# Patient Record
Sex: Male | Born: 1955 | Race: White | Hispanic: No | Marital: Single
Health system: Southern US, Community
[De-identification: ages and names within clinical notes are randomized; demographics above are authoritative.]

## PROBLEM LIST (undated history)

## (undated) DIAGNOSIS — R569 Unspecified convulsions: Secondary | ICD-10-CM

## (undated) DIAGNOSIS — S32009A Unspecified fracture of unspecified lumbar vertebra, initial encounter for closed fracture: Secondary | ICD-10-CM

---

## 2005-01-23 ENCOUNTER — Emergency Department (HOSPITAL_COMMUNITY): Admission: EM | Admit: 2005-01-23 | Discharge: 2005-01-23 | Payer: Self-pay | Admitting: Emergency Medicine

## 2015-05-09 HISTORY — PX: ORIF ANKLE FRACTURE: SUR919

## 2015-05-09 HISTORY — PX: ORIF WRIST FRACTURE: SHX2133

## 2015-05-27 ENCOUNTER — Inpatient Hospital Stay (HOSPITAL_COMMUNITY)
Admission: EM | Admit: 2015-05-27 | Discharge: 2015-06-04 | DRG: 493 | Disposition: A | Discharge status: Skilled Nursing Facility | Payer: Self-pay | Attending: General Surgery | Admitting: General Surgery

## 2015-05-27 ENCOUNTER — Emergency Department (HOSPITAL_COMMUNITY): Payer: Self-pay

## 2015-05-27 DIAGNOSIS — S52502A Unspecified fracture of the lower end of left radius, initial encounter for closed fracture: Secondary | ICD-10-CM | POA: Diagnosis present

## 2015-05-27 DIAGNOSIS — E871 Hypo-osmolality and hyponatremia: Secondary | ICD-10-CM | POA: Diagnosis present

## 2015-05-27 DIAGNOSIS — Y92410 Unspecified street and highway as the place of occurrence of the external cause: Secondary | ICD-10-CM

## 2015-05-27 DIAGNOSIS — S82892A Other fracture of left lower leg, initial encounter for closed fracture: Secondary | ICD-10-CM | POA: Diagnosis present

## 2015-05-27 DIAGNOSIS — S32028A Other fracture of second lumbar vertebra, initial encounter for closed fracture: Secondary | ICD-10-CM

## 2015-05-27 DIAGNOSIS — S0181XA Laceration without foreign body of other part of head, initial encounter: Secondary | ICD-10-CM | POA: Diagnosis present

## 2015-05-27 DIAGNOSIS — F17200 Nicotine dependence, unspecified, uncomplicated: Secondary | ICD-10-CM | POA: Diagnosis present

## 2015-05-27 DIAGNOSIS — S8262XA Displaced fracture of lateral malleolus of left fibula, initial encounter for closed fracture: Principal | ICD-10-CM | POA: Diagnosis present

## 2015-05-27 DIAGNOSIS — S93432A Sprain of tibiofibular ligament of left ankle, initial encounter: Secondary | ICD-10-CM | POA: Diagnosis present

## 2015-05-27 DIAGNOSIS — D62 Acute posthemorrhagic anemia: Secondary | ICD-10-CM | POA: Diagnosis not present

## 2015-05-27 DIAGNOSIS — S81002A Unspecified open wound, left knee, initial encounter: Secondary | ICD-10-CM | POA: Diagnosis present

## 2015-05-27 DIAGNOSIS — S32029A Unspecified fracture of second lumbar vertebra, initial encounter for closed fracture: Secondary | ICD-10-CM | POA: Diagnosis present

## 2015-05-27 DIAGNOSIS — Z419 Encounter for procedure for purposes other than remedying health state, unspecified: Secondary | ICD-10-CM

## 2015-05-27 DIAGNOSIS — IMO0002 Reserved for concepts with insufficient information to code with codable children: Secondary | ICD-10-CM

## 2015-05-27 DIAGNOSIS — S80212A Abrasion, left knee, initial encounter: Secondary | ICD-10-CM | POA: Diagnosis present

## 2015-05-27 DIAGNOSIS — S62102A Fracture of unspecified carpal bone, left wrist, initial encounter for closed fracture: Secondary | ICD-10-CM | POA: Diagnosis present

## 2015-05-27 DIAGNOSIS — T1490XA Injury, unspecified, initial encounter: Secondary | ICD-10-CM

## 2015-05-27 HISTORY — DX: Unspecified convulsions: R56.9

## 2015-05-27 LAB — CBC
HEMATOCRIT: 41.7 % (ref 39.0–52.0)
HEMOGLOBIN: 14 g/dL (ref 13.0–17.0)
MCH: 29.4 pg (ref 26.0–34.0)
MCHC: 33.6 g/dL (ref 30.0–36.0)
MCV: 87.4 fL (ref 78.0–100.0)
Platelets: 214 10*3/uL (ref 150–400)
RBC: 4.77 MIL/uL (ref 4.22–5.81)
RDW: 13.1 % (ref 11.5–15.5)
WBC: 18.9 10*3/uL — AB (ref 4.0–10.5)

## 2015-05-27 LAB — CDS SEROLOGY

## 2015-05-27 LAB — PROTIME-INR
INR: 1.13 (ref 0.00–1.49)
PROTHROMBIN TIME: 14.7 s (ref 11.6–15.2)

## 2015-05-27 LAB — SAMPLE TO BLOOD BANK

## 2015-05-27 MED ORDER — ONDANSETRON HCL 4 MG/2ML IJ SOLN
INTRAMUSCULAR | Status: AC
  Administered 2015-05-27: 4 mg via INTRAVENOUS
  Filled 2015-05-27: qty 2

## 2015-05-27 MED ORDER — TETANUS-DIPHTH-ACELL PERTUSSIS 5-2.5-18.5 LF-MCG/0.5 IM SUSP
0.5000 mL | Freq: Once | INTRAMUSCULAR | Status: AC
  Administered 2015-05-27: 0.5 mL via INTRAMUSCULAR
  Filled 2015-05-27: qty 0.5

## 2015-05-27 MED ORDER — MORPHINE SULFATE (PF) 4 MG/ML IV SOLN
4.0000 mg | Freq: Once | INTRAVENOUS | Status: AC
  Administered 2015-05-27: 4 mg via INTRAVENOUS
  Filled 2015-05-27: qty 1

## 2015-05-27 NOTE — Progress Notes (Signed)
Orthopedic Tech Progress Note Patient Details:  Stephen Juarez 02/16/1956 [id number] Level 2 trauma ortho visit Patient ID: Stephen Juarez, male   DOB: Aug 16, 1955, 60 y.o.   MRN: [id number]   [name] 05/27/2015, 11:08 PM
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
## 2015-05-28 ENCOUNTER — Emergency Department (HOSPITAL_COMMUNITY): Payer: Self-pay

## 2015-05-28 ENCOUNTER — Emergency Department (HOSPITAL_COMMUNITY): Payer: No Typology Code available for payment source

## 2015-05-28 ENCOUNTER — Encounter (HOSPITAL_COMMUNITY): Payer: Self-pay | Admitting: *Deleted

## 2015-05-28 DIAGNOSIS — S82892A Other fracture of left lower leg, initial encounter for closed fracture: Secondary | ICD-10-CM | POA: Diagnosis present

## 2015-05-28 LAB — COMPREHENSIVE METABOLIC PANEL
ALBUMIN: 3.5 g/dL (ref 3.5–5.0)
ALT: 31 U/L (ref 17–63)
ANION GAP: 14 (ref 5–15)
AST: 44 U/L — AB (ref 15–41)
Alkaline Phosphatase: 77 U/L (ref 38–126)
BILIRUBIN TOTAL: 0.2 mg/dL — AB (ref 0.3–1.2)
BUN: 16 mg/dL (ref 6–20)
CO2: 19 mmol/L — AB (ref 22–32)
Calcium: 8.5 mg/dL — ABNORMAL LOW (ref 8.9–10.3)
Chloride: 98 mmol/L — ABNORMAL LOW (ref 101–111)
Creatinine, Ser: 1.02 mg/dL (ref 0.61–1.24)
GFR calc Af Amer: >60 mL/min (ref >60)
GFR calc non Af Amer: >60 mL/min (ref >60)
GLUCOSE: 101 mg/dL — AB (ref 65–99)
POTASSIUM: 4 mmol/L (ref 3.5–5.1)
SODIUM: 131 mmol/L — AB (ref 135–145)
Total Protein: 6.4 g/dL — ABNORMAL LOW (ref 6.5–8.1)

## 2015-05-28 LAB — ETHANOL: Alcohol, Ethyl (B): 79 mg/dL — ABNORMAL HIGH (ref <5)

## 2015-05-28 MED ORDER — IOHEXOL 300 MG/ML  SOLN
100.0000 mL | Freq: Once | INTRAMUSCULAR | Status: AC | PRN
  Administered 2015-05-28: 100 mL via INTRAVENOUS

## 2015-05-28 MED ORDER — OXYCODONE HCL 5 MG PO TABS
10.0000 mg | ORAL_TABLET | ORAL | Status: DC | PRN
  Administered 2015-05-28: 10 mg via ORAL
  Filled 2015-05-28 (×2): qty 2

## 2015-05-28 MED ORDER — CEFAZOLIN SODIUM 1-5 GM-% IV SOLN
1.0000 g | Freq: Once | INTRAVENOUS | Status: AC
  Administered 2015-05-28: 1 g via INTRAVENOUS
  Filled 2015-05-28: qty 50

## 2015-05-28 MED ORDER — PANTOPRAZOLE SODIUM 40 MG PO TBEC
40.0000 mg | DELAYED_RELEASE_TABLET | Freq: Every day | ORAL | Status: DC
  Filled 2015-05-28: qty 1

## 2015-05-28 MED ORDER — CEFAZOLIN SODIUM 1-5 GM-% IV SOLN
1.0000 g | Freq: Three times a day (TID) | INTRAVENOUS | Status: AC
  Administered 2015-05-28 – 2015-06-01 (×11): 1 g via INTRAVENOUS
  Filled 2015-05-28 (×12): qty 50

## 2015-05-28 MED ORDER — BISACODYL 10 MG RE SUPP: 10.0000 mg | Freq: Every day | RECTAL | Status: DC | PRN

## 2015-05-28 MED ORDER — ONDANSETRON HCL 4 MG/2ML IJ SOLN: 4.0000 mg | Freq: Four times a day (QID) | INTRAMUSCULAR | Status: DC | PRN

## 2015-05-28 MED ORDER — MORPHINE SULFATE (PF) 4 MG/ML IV SOLN
4.0000 mg | Freq: Once | INTRAVENOUS | Status: AC
  Administered 2015-05-28: 4 mg via INTRAVENOUS
  Filled 2015-05-28: qty 1

## 2015-05-28 MED ORDER — HYDROMORPHONE HCL 1 MG/ML IJ SOLN
1.0000 mg | Freq: Once | INTRAMUSCULAR | Status: AC
  Administered 2015-05-28: 1 mg via INTRAVENOUS

## 2015-05-28 MED ORDER — LIDOCAINE-EPINEPHRINE 2 %-1:200000 IJ SOLN
20.0000 mL | Freq: Once | INTRAMUSCULAR | Status: AC
Start: 2015-05-28 — End: 2015-05-28
  Administered 2015-05-28: 20 mL
  Filled 2015-05-28: qty 20

## 2015-05-28 MED ORDER — ONDANSETRON HCL 4 MG PO TABS: 4.0000 mg | ORAL_TABLET | Freq: Four times a day (QID) | ORAL | Status: DC | PRN

## 2015-05-28 MED ORDER — DOCUSATE SODIUM 100 MG PO CAPS
100.0000 mg | ORAL_CAPSULE | Freq: Two times a day (BID) | ORAL | Status: DC
  Administered 2015-05-28 – 2015-05-30 (×5): 100 mg via ORAL
  Filled 2015-05-28 (×5): qty 1

## 2015-05-28 MED ORDER — SODIUM CHLORIDE 0.9 % IV BOLUS (SEPSIS)
1000.0000 mL | Freq: Once | INTRAVENOUS | Status: AC
  Administered 2015-05-28: 1000 mL via INTRAVENOUS

## 2015-05-28 MED ORDER — ENOXAPARIN SODIUM 40 MG/0.4ML ~~LOC~~ SOLN
40.0000 mg | SUBCUTANEOUS | Status: DC
  Administered 2015-05-28: 40 mg via SUBCUTANEOUS
  Filled 2015-05-28: qty 0.4

## 2015-05-28 MED ORDER — ONDANSETRON HCL 4 MG/2ML IJ SOLN
4.0000 mg | Freq: Once | INTRAMUSCULAR | Status: AC
  Administered 2015-05-27: 4 mg via INTRAVENOUS

## 2015-05-28 MED ORDER — CEFAZOLIN SODIUM 1-5 GM-% IV SOLN
1.0000 g | Freq: Three times a day (TID) | INTRAVENOUS | Status: DC
  Filled 2015-05-28 (×2): qty 50

## 2015-05-28 MED ORDER — HYDROMORPHONE HCL 1 MG/ML IJ SOLN
INTRAMUSCULAR | Status: AC
  Administered 2015-05-28: 1 mg via INTRAVENOUS
  Filled 2015-05-28: qty 1

## 2015-05-28 MED ORDER — PANTOPRAZOLE SODIUM 40 MG IV SOLR
40.0000 mg | Freq: Every day | INTRAVENOUS | Status: DC
Start: 2015-05-28 — End: 2015-05-29
  Filled 2015-05-28: qty 40

## 2015-05-28 MED ORDER — HYDROMORPHONE HCL 1 MG/ML IJ SOLN
1.0000 mg | INTRAMUSCULAR | Status: DC | PRN
  Administered 2015-05-28: 1 mg via INTRAVENOUS
  Administered 2015-05-29: 2 mg via INTRAVENOUS
  Administered 2015-05-29: 1 mg via INTRAVENOUS
  Filled 2015-05-28: qty 1
  Filled 2015-05-28: qty 2
  Filled 2015-05-28: qty 1

## 2015-05-28 MED ORDER — SODIUM CHLORIDE 0.9 % IV SOLN: INTRAVENOUS | Status: DC
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
## 2015-05-28 NOTE — Clinical Social Work Note (Signed)
Clinical Social Work Assessment  Patient Details  Name: Stephen Juarez MRN: [id number] Date of Birth: 08-28-1955  Date of referral:  05/28/15               Reason for consult:  Trauma, Housing Concerns/Homelessness                Permission sought to share information with:   (n/a) Permission granted to share information::  No  Name::     n/a  Agency::  n/a  Relationship::  n/a  Contact Information:  n/a  Housing/Transportation Living arrangements for the past 2 months:  [place] of Information:  Patient Patient Interpreter Needed:  None Criminal Activity/Legal Involvement Pertinent to Current Situation/Hospitalization:  No - Comment as needed Significant Relationships:  None, [employer] Lives with:  Self Do you feel safe going back to the place where you live?  Yes Need for family participation in patient care:  No (Coment)  Care giving concerns:  Patient currently stays at a homeless shelter in [place]. Patient has lived there for around four months. Patient was concerned about his lost reading glasses.    Social Worker assessment / plan:  BSW intern met with patient at bedside to complete assessment. Patient confirmed that he stays at a homeless shelter in [place]. Patient stated that he has been staying there for around four months now. Patient stated that he has already contacted the homeless shelter and they stated that the discharge paperwork would suffice to be accepted back into the shelter. Patient stated that he would need transport back to the facility once he is discharged. Patient stated that he does have family in [place], but does not have any contact with them. BSW intern will continue to follow and assist as needed.  Employment status:  Unemployed Forensic scientist:  Other (Comment Required) (Med Pay) PT Recommendations:  No Follow Up Information / Referral to community resources:  Shelter  Patient/Family's Response to care: Patient  stated that his care at the hospital has been, "good."  Patient/Family's Understanding of and Emotional Response to Diagnosis, Current Treatment, and Prognosis:  Patient understands his reason for admission, and patient seems to understand his current treatment and post discharge needs.   Emotional Assessment Appearance:  Appears stated age Attitude/Demeanor/Rapport:   (Appropriate) Affect (typically observed):  Appropriate, Pleasant Orientation:  Oriented to Self, Oriented to Place, Oriented to  Time, Oriented to Situation Alcohol / Substance use:  Alcohol Use Psych involvement (Current and /or in the community):  No (Comment)  Discharge Needs  Concerns to be addressed:  Homelessness Readmission within the last 30 days:  No Current discharge risk:  Homeless Barriers to Discharge:  Continued Medical Work up   [employer], [phone]

## 2015-05-28 NOTE — Consult Note (Signed)
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   Musculoskeletal: Positive for back pain.       Pain gluteus bilaterally  Neurological: Negative.   Psychiatric/Behavioral: Negative.    Blood pressure 113/81, pulse 107, temperature 98.7 F (37.1 C), temperature source Oral, resp. rate 22, height 5' 7"  (1.702 m), weight 79.379 kg (175 lb), SpO2 96 %. Physical Exam  Constitutional: He is oriented to person, place, and time. He appears well-developed and well-nourished.  HENT:  Left periorbital ecchymoses left scalp abrasions  Eyes: Conjunctivae and EOM are normal. Pupils are equal, round, and reactive to light.  Musculoskeletal:  Nontender to palpation and percussion in mid and lower lumbar spine. Straight leg raising negative for pain referable to the back.  Neurological: He is alert and oriented to person, place, and time. He has normal reflexes.  Skin: Skin is warm and dry.  Psychiatric: He has a normal mood and affect. His behavior is normal. Judgment and thought content normal.    Assessment/Plan: L2 vertebral body fracture with minimal displacement. This will heal itself without any significant intervention. Lumbar corset may be used for comfort measures. I can follow the patient up as an outpatient in approximately 3  weeks.  Manmeet Arzola J 05/28/2015, 3:36 PM

## 2015-05-28 NOTE — ED Notes (Signed)
Face cleaned, left knee wound irrigated and dressed with Bacitracin and dressing

## 2015-05-28 NOTE — ED Notes (Signed)
Face cleaned, left knee wound cleaned and irrigated.
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
## 2015-05-29 DIAGNOSIS — S32029A Unspecified fracture of second lumbar vertebra, initial encounter for closed fracture: Secondary | ICD-10-CM | POA: Diagnosis present

## 2015-05-29 DIAGNOSIS — S62102A Fracture of unspecified carpal bone, left wrist, initial encounter for closed fracture: Secondary | ICD-10-CM | POA: Diagnosis present

## 2015-05-29 DIAGNOSIS — D62 Acute posthemorrhagic anemia: Secondary | ICD-10-CM | POA: Diagnosis not present

## 2015-05-29 LAB — CBC
HEMATOCRIT: 31.1 % — AB (ref 39.0–52.0)
HEMOGLOBIN: 10.6 g/dL — AB (ref 13.0–17.0)
MCH: 29.6 pg (ref 26.0–34.0)
MCHC: 34.1 g/dL (ref 30.0–36.0)
MCV: 86.9 fL (ref 78.0–100.0)
Platelets: 158 10*3/uL (ref 150–400)
RBC: 3.58 MIL/uL — ABNORMAL LOW (ref 4.22–5.81)
RDW: 13.1 % (ref 11.5–15.5)
WBC: 8.9 10*3/uL (ref 4.0–10.5)

## 2015-05-29 LAB — BASIC METABOLIC PANEL
ANION GAP: 7 (ref 5–15)
BUN: 14 mg/dL (ref 6–20)
CALCIUM: 8 mg/dL — AB (ref 8.9–10.3)
CO2: 22 mmol/L (ref 22–32)
Chloride: 106 mmol/L (ref 101–111)
Creatinine, Ser: 0.9 mg/dL (ref 0.61–1.24)
GFR calc non Af Amer: >60 mL/min (ref >60)
Glucose, Bld: 122 mg/dL — ABNORMAL HIGH (ref 65–99)
Potassium: 3.8 mmol/L (ref 3.5–5.1)
SODIUM: 135 mmol/L (ref 135–145)

## 2015-05-29 LAB — SURGICAL PCR SCREEN
MRSA, PCR: NEGATIVE
Staphylococcus aureus: POSITIVE — AB

## 2015-05-29 MED ORDER — KETOROLAC TROMETHAMINE 30 MG/ML IJ SOLN
30.0000 mg | Freq: Once | INTRAMUSCULAR | Status: AC
  Administered 2015-05-29: 30 mg via INTRAVENOUS
  Filled 2015-05-29: qty 1

## 2015-05-29 MED ORDER — CHLORHEXIDINE GLUCONATE CLOTH 2 % EX PADS
6.0000 | MEDICATED_PAD | Freq: Every day | CUTANEOUS | Status: AC
  Administered 2015-05-29 – 2015-06-02 (×5): 6 via TOPICAL

## 2015-05-29 MED ORDER — OXYCODONE HCL 5 MG PO TABS: 5.0000 mg | ORAL_TABLET | ORAL | Status: DC | PRN

## 2015-05-29 MED ORDER — POLYETHYLENE GLYCOL 3350 17 G PO PACK
17.0000 g | PACK | Freq: Every day | ORAL | Status: DC
  Administered 2015-05-29 – 2015-06-04 (×4): 17 g via ORAL
  Filled 2015-05-29 (×5): qty 1

## 2015-05-29 MED ORDER — MUPIROCIN 2 % EX OINT
1.0000 "application " | TOPICAL_OINTMENT | Freq: Two times a day (BID) | CUTANEOUS | Status: AC
  Administered 2015-05-29 – 2015-06-03 (×10): 1 via NASAL
  Filled 2015-05-29 (×2): qty 22

## 2015-05-29 MED ORDER — HYDROMORPHONE HCL 1 MG/ML IJ SOLN
0.5000 mg | INTRAMUSCULAR | Status: DC | PRN
  Administered 2015-05-31: 0.5 mg via INTRAVENOUS
  Filled 2015-05-29: qty 1

## 2015-05-29 MED ORDER — CEFAZOLIN SODIUM-DEXTROSE 2-3 GM-% IV SOLR
2.0000 g | INTRAVENOUS | Status: AC
  Administered 2015-05-30: 2 g via INTRAVENOUS
  Filled 2015-05-29 (×2): qty 50

## 2015-05-29 MED ORDER — OXYCODONE HCL 5 MG PO TABS
10.0000 mg | ORAL_TABLET | ORAL | Status: DC | PRN
  Administered 2015-05-29 – 2015-05-30 (×5): 10 mg via ORAL
  Filled 2015-05-29 (×6): qty 2
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
## 2015-05-29 NOTE — H&P (Signed)
H&P update  The surgical history has been reviewed and remains accurate without interval change.  The patient was re-examined and patient's physiologic condition has not changed significantly in the last 30 days. The condition still exists that makes this procedure necessary. The treatment plan remains the same, without new options for care.  No new pharmacological allergies or types of therapy has been initiated that would change the plan or the appropriateness of the plan.  The patient and/or family understand the potential benefits and risks.  Mayra Reel, MD 05/29/2015 9:34 PM

## 2015-05-29 NOTE — Progress Notes (Signed)
Plan for ORIF left wrist and ankle tomorrow and washout of left knee wound NPO after midnight Hold lovenox  N. Glee Arvin, MD [facility] [phone] 7:55 AM

## 2015-05-30 ENCOUNTER — Encounter (HOSPITAL_COMMUNITY): Admission: EM | Disposition: A | Discharge status: Skilled Nursing Facility | Payer: Self-pay | Source: Home / Self Care

## 2015-05-30 ENCOUNTER — Inpatient Hospital Stay (HOSPITAL_COMMUNITY): Payer: Self-pay | Admitting: Anesthesiology

## 2015-05-30 ENCOUNTER — Inpatient Hospital Stay (HOSPITAL_COMMUNITY): Payer: No Typology Code available for payment source | Admitting: Anesthesiology

## 2015-05-30 ENCOUNTER — Inpatient Hospital Stay (HOSPITAL_COMMUNITY): Payer: Self-pay

## 2015-05-30 HISTORY — PX: IRRIGATION AND DEBRIDEMENT KNEE: SHX5185

## 2015-05-30 HISTORY — PX: ORIF ANKLE FRACTURE: SHX5408

## 2015-05-30 HISTORY — PX: OPEN REDUCTION INTERNAL FIXATION (ORIF) DISTAL RADIAL FRACTURE: SHX5989

## 2015-05-30 SURGERY — OPEN REDUCTION INTERNAL FIXATION (ORIF) ANKLE FRACTURE
Anesthesia: General | Site: Knee | Laterality: Left

## 2015-05-30 MED ORDER — ACETAMINOPHEN 650 MG RE SUPP: 650.0000 mg | Freq: Four times a day (QID) | RECTAL | Status: DC | PRN

## 2015-05-30 MED ORDER — STERILE WATER FOR INJECTION IJ SOLN
INTRAMUSCULAR | Status: AC
  Filled 2015-05-30: qty 10

## 2015-05-30 MED ORDER — SUGAMMADEX SODIUM 200 MG/2ML IV SOLN
INTRAVENOUS | Status: AC
  Filled 2015-05-30: qty 2

## 2015-05-30 MED ORDER — KETOROLAC TROMETHAMINE 30 MG/ML IJ SOLN
INTRAMUSCULAR | Status: AC
Start: 2015-05-30 — End: 2015-05-31
  Filled 2015-05-30: qty 1

## 2015-05-30 MED ORDER — FENTANYL CITRATE (PF) 250 MCG/5ML IJ SOLN
INTRAMUSCULAR | Status: DC | PRN
  Administered 2015-05-30: 50 ug via INTRAVENOUS
  Administered 2015-05-30 (×2): 150 ug via INTRAVENOUS
  Administered 2015-05-30: 50 ug via INTRAVENOUS

## 2015-05-30 MED ORDER — CEFAZOLIN SODIUM-DEXTROSE 2-3 GM-% IV SOLR
2.0000 g | Freq: Four times a day (QID) | INTRAVENOUS | Status: DC
  Filled 2015-05-30 (×2): qty 50

## 2015-05-30 MED ORDER — OXYCODONE HCL 5 MG PO TABS
5.0000 mg | ORAL_TABLET | ORAL | Status: DC | PRN
  Administered 2015-05-30 – 2015-05-31 (×3): 15 mg via ORAL
  Filled 2015-05-30 (×3): qty 3

## 2015-05-30 MED ORDER — METOCLOPRAMIDE HCL 5 MG PO TABS: 5.0000 mg | ORAL_TABLET | Freq: Three times a day (TID) | ORAL | Status: DC | PRN

## 2015-05-30 MED ORDER — ONDANSETRON HCL 4 MG PO TABS: 4.0000 mg | ORAL_TABLET | Freq: Four times a day (QID) | ORAL | Status: DC | PRN

## 2015-05-30 MED ORDER — ONDANSETRON HCL 4 MG/2ML IJ SOLN
INTRAMUSCULAR | Status: DC | PRN
  Administered 2015-05-30 (×2): 4 mg via INTRAVENOUS

## 2015-05-30 MED ORDER — SODIUM CHLORIDE 0.9 % IV SOLN
INTRAVENOUS | Status: DC
  Administered 2015-05-30 – 2015-05-31 (×2): via INTRAVENOUS

## 2015-05-30 MED ORDER — LACTATED RINGERS IV SOLN
INTRAVENOUS | Status: DC | PRN
  Administered 2015-05-30 (×2): via INTRAVENOUS

## 2015-05-30 MED ORDER — ARTIFICIAL TEARS OP OINT
TOPICAL_OINTMENT | OPHTHALMIC | Status: DC | PRN
  Administered 2015-05-30: 1 via OPHTHALMIC

## 2015-05-30 MED ORDER — FENTANYL CITRATE (PF) 250 MCG/5ML IJ SOLN
INTRAMUSCULAR | Status: AC
  Filled 2015-05-30: qty 5

## 2015-05-30 MED ORDER — ACETAMINOPHEN 325 MG PO TABS
650.0000 mg | ORAL_TABLET | Freq: Four times a day (QID) | ORAL | Status: DC | PRN
  Administered 2015-06-02: 650 mg via ORAL
  Filled 2015-05-30: qty 2

## 2015-05-30 MED ORDER — HYDROMORPHONE HCL 1 MG/ML IJ SOLN
0.2500 mg | INTRAMUSCULAR | Status: DC | PRN
  Administered 2015-05-30: 0.5 mg via INTRAVENOUS

## 2015-05-30 MED ORDER — OXYCODONE HCL 5 MG/5ML PO SOLN: 5.0000 mg | Freq: Once | ORAL | Status: DC | PRN

## 2015-05-30 MED ORDER — MIDAZOLAM HCL 2 MG/2ML IJ SOLN
INTRAMUSCULAR | Status: AC
  Filled 2015-05-30: qty 2

## 2015-05-30 MED ORDER — ONDANSETRON HCL 4 MG/2ML IJ SOLN
INTRAMUSCULAR | Status: AC
  Filled 2015-05-30: qty 2

## 2015-05-30 MED ORDER — 0.9 % SODIUM CHLORIDE (POUR BTL) OPTIME
TOPICAL | Status: DC | PRN
  Administered 2015-05-30: 3000 mL

## 2015-05-30 MED ORDER — MORPHINE SULFATE (PF) 2 MG/ML IV SOLN: 1.0000 mg | INTRAVENOUS | Status: DC | PRN

## 2015-05-30 MED ORDER — GLYCOPYRROLATE 0.2 MG/ML IJ SOLN
INTRAMUSCULAR | Status: AC
  Filled 2015-05-30: qty 1

## 2015-05-30 MED ORDER — ARTIFICIAL TEARS OP OINT
TOPICAL_OINTMENT | OPHTHALMIC | Status: AC
  Filled 2015-05-30: qty 3.5

## 2015-05-30 MED ORDER — METHOCARBAMOL 500 MG PO TABS
500.0000 mg | ORAL_TABLET | Freq: Four times a day (QID) | ORAL | Status: DC | PRN
  Administered 2015-05-30 – 2015-06-03 (×9): 500 mg via ORAL
  Filled 2015-05-30 (×9): qty 1

## 2015-05-30 MED ORDER — LIDOCAINE HCL (CARDIAC) 20 MG/ML IV SOLN
INTRAVENOUS | Status: DC | PRN
  Administered 2015-05-30: 60 mg via INTRATRACHEAL

## 2015-05-30 MED ORDER — ROCURONIUM BROMIDE 50 MG/5ML IV SOLN
INTRAVENOUS | Status: AC
  Filled 2015-05-30: qty 1

## 2015-05-30 MED ORDER — PROPOFOL 10 MG/ML IV BOLUS
INTRAVENOUS | Status: DC | PRN
  Administered 2015-05-30: 140 mg via INTRAVENOUS

## 2015-05-30 MED ORDER — VANCOMYCIN HCL IN DEXTROSE 1-5 GM/200ML-% IV SOLN: 1000.0000 mg | Freq: Once | INTRAVENOUS | Status: DC

## 2015-05-30 MED ORDER — SUGAMMADEX SODIUM 200 MG/2ML IV SOLN
INTRAVENOUS | Status: DC | PRN
  Administered 2015-05-30: 200 mg via INTRAVENOUS

## 2015-05-30 MED ORDER — BUPIVACAINE HCL (PF) 0.25 % IJ SOLN
INTRAMUSCULAR | Status: AC
  Filled 2015-05-30: qty 30

## 2015-05-30 MED ORDER — METHOCARBAMOL 1000 MG/10ML IJ SOLN
500.0000 mg | Freq: Four times a day (QID) | INTRAVENOUS | Status: DC | PRN
  Filled 2015-05-30: qty 5

## 2015-05-30 MED ORDER — BUPIVACAINE HCL (PF) 0.25 % IJ SOLN
INTRAMUSCULAR | Status: DC | PRN
  Administered 2015-05-30: 30 mL

## 2015-05-30 MED ORDER — METOCLOPRAMIDE HCL 5 MG/ML IJ SOLN: 5.0000 mg | Freq: Three times a day (TID) | INTRAMUSCULAR | Status: DC | PRN

## 2015-05-30 MED ORDER — HYDROMORPHONE HCL 1 MG/ML IJ SOLN
INTRAMUSCULAR | Status: AC
  Filled 2015-05-30: qty 1

## 2015-05-30 MED ORDER — SODIUM CHLORIDE 0.9 % IR SOLN
Status: DC | PRN
  Administered 2015-05-30: 6000 mL

## 2015-05-30 MED ORDER — PROPOFOL 10 MG/ML IV BOLUS
INTRAVENOUS | Status: AC
  Filled 2015-05-30: qty 20

## 2015-05-30 MED ORDER — MIDAZOLAM HCL 2 MG/2ML IJ SOLN
INTRAMUSCULAR | Status: DC | PRN
  Administered 2015-05-30: 2 mg via INTRAVENOUS

## 2015-05-30 MED ORDER — VANCOMYCIN HCL IN DEXTROSE 1-5 GM/200ML-% IV SOLN
INTRAVENOUS | Status: AC
  Filled 2015-05-30: qty 200

## 2015-05-30 MED ORDER — KETOROLAC TROMETHAMINE 30 MG/ML IJ SOLN
30.0000 mg | Freq: Once | INTRAMUSCULAR | Status: AC
  Administered 2015-05-30: 30 mg via INTRAVENOUS

## 2015-05-30 MED ORDER — VECURONIUM BROMIDE 10 MG IV SOLR
INTRAVENOUS | Status: AC
  Filled 2015-05-30: qty 10

## 2015-05-30 MED ORDER — SORBITOL 70 % SOLN: 30.0000 mL | Freq: Every day | Status: DC | PRN

## 2015-05-30 MED ORDER — OXYCODONE HCL ER 10 MG PO T12A
10.0000 mg | EXTENDED_RELEASE_TABLET | Freq: Two times a day (BID) | ORAL | Status: DC
  Administered 2015-05-30 – 2015-06-04 (×9): 10 mg via ORAL
  Filled 2015-05-30 (×10): qty 1

## 2015-05-30 MED ORDER — DIPHENHYDRAMINE HCL 12.5 MG/5ML PO ELIX
25.0000 mg | ORAL_SOLUTION | ORAL | Status: DC | PRN
  Administered 2015-06-01 – 2015-06-02 (×3): 25 mg via ORAL
  Filled 2015-05-30 (×4): qty 10

## 2015-05-30 MED ORDER — GLYCOPYRROLATE 0.2 MG/ML IJ SOLN
INTRAMUSCULAR | Status: DC | PRN
  Administered 2015-05-30: 0.1 mg via INTRAVENOUS

## 2015-05-30 MED ORDER — MAGNESIUM CITRATE PO SOLN: 1.0000 | Freq: Once | ORAL | Status: DC | PRN

## 2015-05-30 MED ORDER — OXYCODONE HCL 5 MG PO TABS: 5.0000 mg | ORAL_TABLET | Freq: Once | ORAL | Status: DC | PRN

## 2015-05-30 MED ORDER — ROCURONIUM BROMIDE 100 MG/10ML IV SOLN
INTRAVENOUS | Status: DC | PRN
  Administered 2015-05-30: 40 mg via INTRAVENOUS
  Administered 2015-05-30: 10 mg via INTRAVENOUS

## 2015-05-30 MED ORDER — LACTATED RINGERS IV SOLN
INTRAVENOUS | Status: DC
  Administered 2015-05-30: 15:00:00 via INTRAVENOUS

## 2015-05-30 MED ORDER — VECURONIUM BROMIDE 10 MG IV SOLR
INTRAVENOUS | Status: DC | PRN
  Administered 2015-05-30: 2 mg via INTRAVENOUS
  Administered 2015-05-30: 1 mg via INTRAVENOUS
  Administered 2015-05-30: 2 mg via INTRAVENOUS

## 2015-05-30 MED ORDER — POLYETHYLENE GLYCOL 3350 17 G PO PACK
17.0000 g | PACK | Freq: Every day | ORAL | Status: DC | PRN
  Filled 2015-05-30: qty 1

## 2015-05-30 MED ORDER — ENOXAPARIN SODIUM 40 MG/0.4ML ~~LOC~~ SOLN
40.0000 mg | SUBCUTANEOUS | Status: AC
  Administered 2015-05-31: 40 mg via SUBCUTANEOUS
  Filled 2015-05-30: qty 0.4

## 2015-05-30 MED ORDER — PROMETHAZINE HCL 25 MG/ML IJ SOLN: 6.2500 mg | INTRAMUSCULAR | Status: DC | PRN

## 2015-05-30 MED ORDER — LIDOCAINE HCL (CARDIAC) 20 MG/ML IV SOLN
INTRAVENOUS | Status: AC
  Filled 2015-05-30: qty 5

## 2015-05-30 MED ORDER — ONDANSETRON HCL 4 MG/2ML IJ SOLN
4.0000 mg | Freq: Four times a day (QID) | INTRAMUSCULAR | Status: DC | PRN
  Administered 2015-06-01: 4 mg via INTRAVENOUS
  Filled 2015-05-30: qty 2

## 2015-05-30 SURGICAL SUPPLY — 124 items
BANDAGE ACE 4X5 VEL STRL LF (GAUZE/BANDAGES/DRESSINGS) ×4 IMPLANT
BANDAGE ACE 6X5 VEL STRL LF (GAUZE/BANDAGES/DRESSINGS) ×2 IMPLANT
BANDAGE ELASTIC 3 VELCRO ST LF (GAUZE/BANDAGES/DRESSINGS) ×5 IMPLANT
BANDAGE ELASTIC 4 VELCRO ST LF (GAUZE/BANDAGES/DRESSINGS) ×5 IMPLANT
BANDAGE ELASTIC 6 VELCRO ST LF (GAUZE/BANDAGES/DRESSINGS) IMPLANT
BANDAGE ESMARK 6X9 LF (GAUZE/BANDAGES/DRESSINGS) IMPLANT
BIT DRILL 2.2 SS TIBIAL (BIT) ×2 IMPLANT
BLADE SURG 10 STRL SS (BLADE) ×5 IMPLANT
BLADE SURG 15 STRL LF DISP TIS (BLADE) ×3 IMPLANT
BLADE SURG 15 STRL SS (BLADE) ×20
BLADE SURG ROTATE 9660 (MISCELLANEOUS) IMPLANT
BNDG CMPR 9X4 STRL LF SNTH (GAUZE/BANDAGES/DRESSINGS) ×3
BNDG CMPR 9X6 STRL LF SNTH (GAUZE/BANDAGES/DRESSINGS)
BNDG CMPR MED 15X6 ELC VLCR LF (GAUZE/BANDAGES/DRESSINGS) ×3
BNDG COHESIVE 4X5 TAN STRL (GAUZE/BANDAGES/DRESSINGS) ×5 IMPLANT
BNDG COHESIVE 6X5 TAN STRL LF (GAUZE/BANDAGES/DRESSINGS) ×5 IMPLANT
BNDG ELASTIC 6X15 VLCR STRL LF (GAUZE/BANDAGES/DRESSINGS) ×2 IMPLANT
BNDG ESMARK 4X9 LF (GAUZE/BANDAGES/DRESSINGS) ×5 IMPLANT
BNDG ESMARK 6X9 LF (GAUZE/BANDAGES/DRESSINGS)
BNDG GAUZE ELAST 4 BULKY (GAUZE/BANDAGES/DRESSINGS) ×5 IMPLANT
CANISTER SUCT 3000ML PPV (MISCELLANEOUS) ×5 IMPLANT
CANISTER WOUND CARE 500ML ATS (WOUND CARE) ×2 IMPLANT
CORDS BIPOLAR (ELECTRODE) ×5 IMPLANT
COVER SURGICAL LIGHT HANDLE (MISCELLANEOUS) ×5 IMPLANT
CUFF TOURNIQUET SINGLE 18IN (TOURNIQUET CUFF) ×5 IMPLANT
CUFF TOURNIQUET SINGLE 24IN (TOURNIQUET CUFF) IMPLANT
CUFF TOURNIQUET SINGLE 34IN LL (TOURNIQUET CUFF) IMPLANT
CUFF TOURNIQUET SINGLE 44IN (TOURNIQUET CUFF) ×5 IMPLANT
DRAPE C-ARM 42X72 X-RAY (DRAPES) ×5 IMPLANT
DRAPE C-ARMOR (DRAPES) ×5 IMPLANT
DRAPE IMP U-DRAPE 54X76 (DRAPES) ×5 IMPLANT
DRAPE INCISE IOBAN 66X45 STRL (DRAPES) ×2 IMPLANT
DRAPE ORTHO SPLIT 77X108 STRL (DRAPES) ×5
DRAPE SURG ORHT 6 SPLT 77X108 (DRAPES) ×3 IMPLANT
DRAPE U-SHAPE 47X51 STRL (DRAPES) ×5 IMPLANT
DRILL 2.6X122MM WL AO SHAFT (BIT) ×2 IMPLANT
DRSG MEPITEL 4X7.2 (GAUZE/BANDAGES/DRESSINGS) ×2 IMPLANT
DRSG PAD ABDOMINAL 8X10 ST (GAUZE/BANDAGES/DRESSINGS) ×6 IMPLANT
DRSG VAC ATS SM SENSATRAC (GAUZE/BANDAGES/DRESSINGS) ×2 IMPLANT
DURAPREP 26ML APPLICATOR (WOUND CARE) ×5 IMPLANT
ELECT CAUTERY BLADE 6.4 (BLADE) ×10 IMPLANT
ELECT REM PT RETURN 9FT ADLT (ELECTROSURGICAL) ×5
ELECTRODE REM PT RTRN 9FT ADLT (ELECTROSURGICAL) ×3 IMPLANT
FACESHIELD WRAPAROUND (MASK) ×5 IMPLANT
FACESHIELD WRAPAROUND OR TEAM (MASK) ×3 IMPLANT
GAUZE SPONGE 4X4 12PLY STRL (GAUZE/BANDAGES/DRESSINGS) ×5 IMPLANT
GAUZE XEROFORM 1X8 LF (GAUZE/BANDAGES/DRESSINGS) ×5 IMPLANT
GAUZE XEROFORM 5X9 LF (GAUZE/BANDAGES/DRESSINGS) ×5 IMPLANT
GLOVE SKINSENSE NS SZ7.5 (GLOVE) ×8
GLOVE SKINSENSE STRL SZ7.5 (GLOVE) ×12 IMPLANT
GLOVE SURG SYN 7.5  E (GLOVE) ×4
GLOVE SURG SYN 7.5 E (GLOVE) ×6 IMPLANT
GLOVE SURG SYN 7.5 PF PI (GLOVE) ×6 IMPLANT
GOWN STRL REIN XL XLG (GOWN DISPOSABLE) ×10 IMPLANT
HANDPIECE INTERPULSE COAX TIP (DISPOSABLE) ×5
K-WIRE 1.6 (WIRE) ×5
K-WIRE FX5X1.6XNS BN SS (WIRE) ×3
K-WIRE ORTHOPEDIC 1.4X150L (WIRE) ×10
KIT BASIN OR (CUSTOM PROCEDURE TRAY) ×5 IMPLANT
KIT ROOM TURNOVER OR (KITS) ×5 IMPLANT
KWIRE FX5X1.6XNS BN SS (WIRE) IMPLANT
KWIRE ORTHOPEDIC 1.4X150L (WIRE) IMPLANT
MANIFOLD NEPTUNE II (INSTRUMENTS) ×5 IMPLANT
NDL HYPO 25GX1X1/2 BEV (NEEDLE) IMPLANT
NEEDLE 22X1 1/2 (OR ONLY) (NEEDLE) IMPLANT
NEEDLE HYPO 25GX1X1/2 BEV (NEEDLE) IMPLANT
NS IRRIG 1000ML POUR BTL (IV SOLUTION) ×5 IMPLANT
PACK ORTHO EXTREMITY (CUSTOM PROCEDURE TRAY) ×5 IMPLANT
PACK UNIVERSAL I (CUSTOM PROCEDURE TRAY) ×5 IMPLANT
PAD ABD 8X10 STRL (GAUZE/BANDAGES/DRESSINGS) ×2 IMPLANT
PAD ARMBOARD 7.5X6 YLW CONV (MISCELLANEOUS) ×10 IMPLANT
PAD CAST 3X4 CTTN HI CHSV (CAST SUPPLIES) ×6 IMPLANT
PAD CAST 4YDX4 CTTN HI CHSV (CAST SUPPLIES) ×3 IMPLANT
PADDING CAST COTTON 3X4 STRL (CAST SUPPLIES) ×10
PADDING CAST COTTON 4X4 STRL (CAST SUPPLIES) ×10
PADDING CAST COTTON 6X4 STRL (CAST SUPPLIES) ×5 IMPLANT
PLATE 6H 113MM (Plate) ×2 IMPLANT
PLATE MEDIUM DVR LEFT (Plate) ×2 IMPLANT
SCREW BONE 3.5X16MM (Screw) ×2 IMPLANT
SCREW BONE 3.5X20MM (Screw) ×2 IMPLANT
SCREW BONE NON-LCKING 3.5X12MM (Screw) ×6 IMPLANT
SCREW LOCK 14X2.7X 3 LD TPR (Screw) IMPLANT
SCREW LOCK 16X2.7X 3 LD TPR (Screw) IMPLANT
SCREW LOCK 18X2.7X 3 LD TPR (Screw) IMPLANT
SCREW LOCK 20X2.7X 3 LD TPR (Screw) IMPLANT
SCREW LOCKING 2.7X14 (Screw) ×15 IMPLANT
SCREW LOCKING 2.7X15MM (Screw) ×4 IMPLANT
SCREW LOCKING 2.7X16 (Screw) ×15 IMPLANT
SCREW LOCKING 2.7X18 (Screw) ×5 IMPLANT
SCREW LOCKING 2.7X20MM (Screw) ×10 IMPLANT
SCREW LOCKING 3.5X16MM (Screw) ×2 IMPLANT
SCREW NLOCK 3.5X55 (Screw) ×2 IMPLANT
SCREW NONLOCK 2.7X18MM (Screw) ×2 IMPLANT
SET CYSTO W/LG BORE CLAMP LF (SET/KITS/TRAYS/PACK) ×2 IMPLANT
SET HNDPC FAN SPRY TIP SCT (DISPOSABLE) ×3 IMPLANT
SPLINT FIBERGLASS 3X12 (CAST SUPPLIES) ×4 IMPLANT
SPLINT FIBERGLASS 3X35 (CAST SUPPLIES) ×4 IMPLANT
SPLINT FIBERGLASS 4X30 (CAST SUPPLIES) ×4 IMPLANT
SPONGE GAUZE 4X4 12PLY STER LF (GAUZE/BANDAGES/DRESSINGS) ×4 IMPLANT
SPONGE LAP 18X18 X RAY DECT (DISPOSABLE) ×5 IMPLANT
SPONGE LAP 4X18 X RAY DECT (DISPOSABLE) IMPLANT
STOCKINETTE IMPERVIOUS 9X36 MD (GAUZE/BANDAGES/DRESSINGS) ×5 IMPLANT
SUCTION FRAZIER HANDLE 10FR (MISCELLANEOUS) ×2
SUCTION TUBE FRAZIER 10FR DISP (MISCELLANEOUS) ×3 IMPLANT
SUT ETHILON 2 0 FS 18 (SUTURE) ×5 IMPLANT
SUT ETHILON 3 0 PS 1 (SUTURE) ×4 IMPLANT
SUT ETHILON 4 0 PS 2 18 (SUTURE) ×5 IMPLANT
SUT MNCRL AB 4-0 PS2 18 (SUTURE) IMPLANT
SUT MON AB 2-0 CT1 36 (SUTURE) ×5 IMPLANT
SUT PROLENE 3 0 PS 2 (SUTURE) IMPLANT
SUT VIC AB 0 CT1 27 (SUTURE) ×10
SUT VIC AB 0 CT1 27XBRD ANBCTR (SUTURE) IMPLANT
SUT VIC AB 2-0 CT1 27 (SUTURE) ×10
SUT VIC AB 2-0 CT1 TAPERPNT 27 (SUTURE) IMPLANT
SUT VIC AB 3-0 FS2 27 (SUTURE) IMPLANT
SYR CONTROL 10ML LL (SYRINGE) ×2 IMPLANT
TOWEL OR 17X24 6PK STRL BLUE (TOWEL DISPOSABLE) ×5 IMPLANT
TOWEL OR 17X26 10 PK STRL BLUE (TOWEL DISPOSABLE) ×10 IMPLANT
TUBE ANAEROBIC SPECIMEN COL (MISCELLANEOUS) ×5 IMPLANT
TUBE CONNECTING 12'X1/4 (SUCTIONS) ×1
TUBE CONNECTING 12X1/4 (SUCTIONS) ×4 IMPLANT
UNDERPAD 30X30 INCONTINENT (UNDERPADS AND DIAPERS) ×5 IMPLANT
WATER STERILE IRR 1000ML POUR (IV SOLUTION) ×5 IMPLANT
YANKAUER SUCT BULB TIP NO VENT (SUCTIONS) ×5 IMPLANT

## 2015-05-30 NOTE — Progress Notes (Signed)
PT Cancellation Note  Patient Details Name: Stephen Juarez MRN: [id number] DOB: [birth year]   Cancelled Treatment:    Reason Eval/Treat Not Completed: Other (comment)   Plan for surgeries today;   Will follow-up for PT eval postop;   Thanks,  Van Clines, PT  Acute Rehabilitation Services Pager [phone] Office [phone]    Van Clines [facility] 05/30/2015, 1:53 PM

## 2015-05-30 NOTE — Clinical Documentation Improvement (Signed)
Trauma  Abnormal Lab/Test Results:  Na+ 131 on admit  Please document response in next progress note NOT in BPA drop down box if applicable. Thanks!  Possible Clinical Conditions associated with below indicators  Hyponatremia  Hypernatremia  Other Condition  Cannot Clinically Determine  Supporting Information:  Treatment Provided:  0.9% NaCl IV infusion  Repeat draw was 135  Please exercise your independent, professional judgment when responding. A specific answer is not anticipated or expected.  Thank You,  Shellee Milo RN, BSN, CCDS Health Information Management [place] [phone]; Cell: [phone]

## 2015-05-30 NOTE — Progress Notes (Signed)
Rept called to [facility] RN in preop holding area. Pt transferred to OR via hospital bed without incident. No change from AM assessment. Family aware.

## 2015-05-30 NOTE — Transfer of Care (Signed)
Immediate Anesthesia Transfer of Care Note  Patient: Stephen Juarez  Procedure(s) Performed: Procedure(s): OPEN REDUCTION INTERNAL FIXATION (ORIF) LEFT ANKLE FRACTURE (Left) OPEN REDUCTION INTERNAL FIXATION (ORIF) LEFT DISTAL RADIUS FRACTURE (Left) IRRIGATION AND DEBRIDEMENT LEFT KNEE (Left)  Patient Location: PACU  Anesthesia Type:General  Level of Consciousness: awake, alert , oriented and patient cooperative  Airway & Oxygen Therapy: Patient Spontanous Breathing and Patient connected to nasal cannula oxygen  Post-op Assessment: Report given to RN, Post -op Vital signs reviewed and stable and Patient moving all extremities X 4  Post vital signs: Reviewed and stable  Last Vitals:  Filed Vitals:   05/30/15 1337 05/30/15 1748  BP: 105/75   Pulse: 87   Temp: 36.8 C 36.4 C  Resp: 20     Complications: No apparent anesthesia complications

## 2015-05-30 NOTE — Progress Notes (Signed)
OT Cancellation Note  Patient Details Name: Stephen Juarez MRN: [id number] DOB: 1955-12-24   Cancelled Treatment:    Reason Eval/Treat Not Completed: Patient at procedure or test/ unavailable. Pt taken down for surgeries today. Will follow-up for OT eval postop.  Nils Pyle, OTR/L Pager: [phone] 05/30/2015, 2:16 PM

## 2015-05-30 NOTE — Anesthesia Preprocedure Evaluation (Addendum)
Anesthesia Evaluation  Patient identified by MRN, date of birth, ID band Patient awake    Reviewed: Allergy & Precautions, NPO status , Patient's Chart, lab work & pertinent test results  Airway Mallampati: II  TM Distance: >3 FB Neck ROM: Full    Dental  (+) Dental Advisory Given   Pulmonary Current Smoker,    breath sounds clear to auscultation       Cardiovascular negative cardio ROS   Rhythm:Regular Rate:Normal     Neuro/Psych negative neurological ROS     GI/Hepatic negative GI ROS, Neg liver ROS,   Endo/Other  negative endocrine ROS  Renal/GU negative Renal ROS     Musculoskeletal negative musculoskeletal ROS (+)   Abdominal   Peds  Hematology  (+) anemia ,   Anesthesia Other Findings   Reproductive/Obstetrics                            Lab Results  Component Value Date   WBC 8.9 05/29/2015   HGB 10.6* 05/29/2015   HCT 31.1* 05/29/2015   MCV 86.9 05/29/2015   PLT 158 05/29/2015   Lab Results  Component Value Date   CREATININE 0.90 05/29/2015   BUN 14 05/29/2015   NA 135 05/29/2015   K 3.8 05/29/2015   CL 106 05/29/2015   CO2 22 05/29/2015    Anesthesia Physical Anesthesia Plan  ASA: II  Anesthesia Plan: General   Post-op Pain Management:    Induction: Intravenous  Airway Management Planned: Oral ETT  Additional Equipment:   Intra-op Plan:   Post-operative Plan: Extubation in OR  Informed Consent: I have reviewed the patients History and Physical, chart, labs and discussed the procedure including the risks, benefits and alternatives for the proposed anesthesia with the patient or authorized representative who has indicated his/her understanding and acceptance.   Dental advisory given  Plan Discussed with: CRNA  Anesthesia Plan Comments:         Anesthesia Quick Evaluation
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
## 2015-05-30 NOTE — Anesthesia Procedure Notes (Signed)
Procedure Name: Intubation Date/Time: 05/30/2015 2:51 PM Performed by: Jerilee Hoh Pre-anesthesia Checklist: Patient identified, Emergency Drugs available, Suction available and Patient being monitored Patient Re-evaluated:Patient Re-evaluated prior to inductionOxygen Delivery Method: Circle system utilized Preoxygenation: Pre-oxygenation with 100% oxygen Intubation Type: IV induction Ventilation: Mask ventilation without difficulty Laryngoscope Size: Mac and 3 Grade View: Grade I Tube type: Oral Tube size: 7.5 mm Number of attempts: 1 Airway Equipment and Method: Stylet Placement Confirmation: ETT inserted through vocal cords under direct vision,  positive ETCO2 and breath sounds checked- equal and bilateral Secured at: 23 cm Tube secured with: Tape Dental Injury: Teeth and Oropharynx as per pre-operative assessment

## 2015-05-31 ENCOUNTER — Encounter (HOSPITAL_COMMUNITY): Payer: Self-pay | Admitting: General Practice

## 2015-05-31 DIAGNOSIS — S81002A Unspecified open wound, left knee, initial encounter: Secondary | ICD-10-CM | POA: Diagnosis present

## 2015-05-31 MED ORDER — OXYCODONE HCL 5 MG PO TABS
10.0000 mg | ORAL_TABLET | ORAL | Status: DC | PRN
  Administered 2015-05-31 (×2): 15 mg via ORAL
  Administered 2015-06-01: 10 mg via ORAL
  Administered 2015-06-01: 15 mg via ORAL
  Administered 2015-06-01: 10 mg via ORAL
  Administered 2015-06-02: 15 mg via ORAL
  Administered 2015-06-03 – 2015-06-04 (×3): 20 mg via ORAL
  Filled 2015-05-31: qty 3
  Filled 2015-05-31: qty 2
  Filled 2015-05-31 (×2): qty 4
  Filled 2015-05-31: qty 2
  Filled 2015-05-31 (×2): qty 3
  Filled 2015-05-31: qty 4
  Filled 2015-05-31: qty 3

## 2015-05-31 MED ORDER — DOCUSATE SODIUM 100 MG PO CAPS
200.0000 mg | ORAL_CAPSULE | Freq: Two times a day (BID) | ORAL | Status: DC
  Administered 2015-05-31 – 2015-06-04 (×8): 200 mg via ORAL
  Filled 2015-05-31 (×8): qty 2

## 2015-05-31 MED ORDER — CEFAZOLIN SODIUM-DEXTROSE 2-3 GM-% IV SOLR
2.0000 g | INTRAVENOUS | Status: DC
  Filled 2015-05-31: qty 50

## 2015-05-31 MED ORDER — CEFAZOLIN SODIUM 1-5 GM-% IV SOLN: 1.0000 g | Freq: Three times a day (TID) | INTRAVENOUS | Status: DC
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
## 2015-05-31 NOTE — Progress Notes (Signed)
Met with pt to discuss dc plans; was alerted this morning that pt likely will need home VAC upon dc.  Pt states he planned to dc to [facility], and has concerns that he will be able to manage there.  He states that there will be no one to assist him at the shelter, and that he has a small cubicle he stays in.  If dc'd with wound vac, pt will need an area to store wound VAC supplies and to plug VAC in to charge.  Confirmed with [facility] that they would likely be unable to manage a wound VAC in a shelter.   Pt has no other options for discharge at this time.   CSW consulted to facilitate possible dc to SNF at discharge.   Will follow progress.  Reinaldo Raddle, RN, BSN  Trauma/Neuro ICU Case Manager [phone]

## 2015-05-31 NOTE — Progress Notes (Signed)
   Subjective:  Patient reports pain as mild.  No events.  Objective:   VITALS:   Filed Vitals:   05/30/15 1904 05/30/15 2019 05/30/15 2300 05/31/15 0430  BP: 128/76 115/76 108/71 128/82  Pulse: 93 99 89 99  Temp: 97.8 F (36.6 C) 97.3 F (36.3 C) 98.4 F (36.9 C) 98.5 F (36.9 C)  TempSrc: Oral Oral Oral Oral  Resp: Height:      Weight:      SpO2: 94% 99% 100% 99%    Fingers, toes wwp Splints intact NVI Wound VAC in place   Lab Results  Component Value Date   WBC 8.9 05/29/2015   HGB 10.6* 05/29/2015   HCT 31.1* 05/29/2015   MCV 86.9 05/29/2015   PLT 158 05/29/2015     Assessment/Plan:  1 Day Post-Op   - NWB LLE - PFWB LUE - up with PT/OT - lovenox for DVT ppx - will return to OR for repeat I&D left knee wound tomorrow - recommend beginning application for home wound vac as patient will likely need this to allow wound to heal by secondary intention - NPO after midnight  Cheral Almas 05/31/2015, 7:43 AM [phone]

## 2015-05-31 NOTE — Anesthesia Postprocedure Evaluation (Signed)
Anesthesia Post Note  Patient: Stephen Juarez  Procedure(s) Performed: Procedure(s) (LRB): OPEN REDUCTION INTERNAL FIXATION (ORIF) LEFT ANKLE FRACTURE (Left) OPEN REDUCTION INTERNAL FIXATION (ORIF) LEFT DISTAL RADIUS FRACTURE (Left) IRRIGATION AND DEBRIDEMENT LEFT KNEE (Left)  Patient location during evaluation: PACU Anesthesia Type: General Level of consciousness: awake and alert and patient cooperative Pain management: pain level controlled Vital Signs Assessment: post-procedure vital signs reviewed and stable Respiratory status: spontaneous breathing and respiratory function stable Cardiovascular status: stable Anesthetic complications: no    Last Vitals:  Filed Vitals:   05/30/15 2300 05/31/15 0430  BP: 108/71 128/82  Pulse: 89 99  Temp: 36.9 C 36.9 C  Resp: 18 18    Last Pain:  Filed Vitals:   05/31/15 0612  PainSc: Asleep                 Aimie Wagman S

## 2015-06-01 ENCOUNTER — Inpatient Hospital Stay (HOSPITAL_COMMUNITY): Payer: No Typology Code available for payment source | Admitting: Anesthesiology

## 2015-06-01 ENCOUNTER — Encounter (HOSPITAL_COMMUNITY): Admission: EM | Disposition: A | Discharge status: Skilled Nursing Facility | Payer: Self-pay | Source: Home / Self Care

## 2015-06-01 HISTORY — PX: I & D EXTREMITY: SHX5045

## 2015-06-01 SURGERY — IRRIGATION AND DEBRIDEMENT EXTREMITY
Anesthesia: General | Laterality: Left

## 2015-06-01 MED ORDER — HYDROMORPHONE HCL 1 MG/ML IJ SOLN: 0.2500 mg | INTRAMUSCULAR | Status: DC | PRN

## 2015-06-01 MED ORDER — MIDAZOLAM HCL 2 MG/2ML IJ SOLN
INTRAMUSCULAR | Status: AC
  Filled 2015-06-01: qty 2

## 2015-06-01 MED ORDER — LIDOCAINE HCL (CARDIAC) 20 MG/ML IV SOLN
INTRAVENOUS | Status: DC | PRN
  Administered 2015-06-01: 80 mg via INTRAVENOUS

## 2015-06-01 MED ORDER — SODIUM CHLORIDE 0.9 % IR SOLN
Status: DC | PRN
  Administered 2015-06-01: 1000 mL
  Administered 2015-06-01: 3000 mL

## 2015-06-01 MED ORDER — ONDANSETRON HCL 4 MG/2ML IJ SOLN
INTRAMUSCULAR | Status: AC
  Filled 2015-06-01: qty 2

## 2015-06-01 MED ORDER — PROPOFOL 10 MG/ML IV BOLUS
INTRAVENOUS | Status: DC | PRN
  Administered 2015-06-01: 200 mg via INTRAVENOUS

## 2015-06-01 MED ORDER — LACTATED RINGERS IV SOLN: INTRAVENOUS | Status: DC

## 2015-06-01 MED ORDER — FENTANYL CITRATE (PF) 250 MCG/5ML IJ SOLN
INTRAMUSCULAR | Status: AC
  Filled 2015-06-01: qty 5

## 2015-06-01 MED ORDER — PROMETHAZINE HCL 25 MG/ML IJ SOLN: 6.2500 mg | INTRAMUSCULAR | Status: DC | PRN

## 2015-06-01 MED ORDER — LIDOCAINE HCL (CARDIAC) 20 MG/ML IV SOLN
INTRAVENOUS | Status: AC
  Filled 2015-06-01: qty 5

## 2015-06-01 MED ORDER — FENTANYL CITRATE (PF) 100 MCG/2ML IJ SOLN
INTRAMUSCULAR | Status: DC | PRN
  Administered 2015-06-01 (×2): 50 ug via INTRAVENOUS

## 2015-06-01 MED ORDER — ROCURONIUM BROMIDE 50 MG/5ML IV SOLN
INTRAVENOUS | Status: AC
  Filled 2015-06-01: qty 1

## 2015-06-01 MED ORDER — MEPERIDINE HCL 25 MG/ML IJ SOLN: 6.2500 mg | INTRAMUSCULAR | Status: DC | PRN

## 2015-06-01 MED ORDER — LACTATED RINGERS IV SOLN
INTRAVENOUS | Status: DC | PRN
  Administered 2015-06-01: 13:00:00 via INTRAVENOUS

## 2015-06-01 MED ORDER — PHENYLEPHRINE 40 MCG/ML (10ML) SYRINGE FOR IV PUSH (FOR BLOOD PRESSURE SUPPORT)
PREFILLED_SYRINGE | INTRAVENOUS | Status: AC
  Filled 2015-06-01: qty 10

## 2015-06-01 MED ORDER — PROPOFOL 10 MG/ML IV BOLUS
INTRAVENOUS | Status: AC
  Filled 2015-06-01: qty 20

## 2015-06-01 MED ORDER — PHENYLEPHRINE HCL 10 MG/ML IJ SOLN
INTRAMUSCULAR | Status: DC | PRN
  Administered 2015-06-01 (×4): 80 ug via INTRAVENOUS

## 2015-06-01 MED ORDER — DEXAMETHASONE SODIUM PHOSPHATE 4 MG/ML IJ SOLN
INTRAMUSCULAR | Status: AC
  Filled 2015-06-01: qty 2

## 2015-06-01 MED ORDER — CEFAZOLIN SODIUM-DEXTROSE 2-3 GM-% IV SOLR
INTRAVENOUS | Status: DC | PRN
  Administered 2015-06-01: 2 g via INTRAVENOUS

## 2015-06-01 SURGICAL SUPPLY — 66 items
BANDAGE ELASTIC 3 VELCRO ST LF (GAUZE/BANDAGES/DRESSINGS) IMPLANT
BLADE SURG 10 STRL SS (BLADE) ×3 IMPLANT
BNDG COHESIVE 1X5 TAN STRL LF (GAUZE/BANDAGES/DRESSINGS) IMPLANT
BNDG COHESIVE 4X5 TAN STRL (GAUZE/BANDAGES/DRESSINGS) ×3 IMPLANT
BNDG COHESIVE 6X5 TAN STRL LF (GAUZE/BANDAGES/DRESSINGS) ×6 IMPLANT
BNDG CONFORM 3 STRL LF (GAUZE/BANDAGES/DRESSINGS) IMPLANT
BNDG GAUZE STRTCH 6 (GAUZE/BANDAGES/DRESSINGS) ×9 IMPLANT
CANISTER WOUND CARE 500ML ATS (WOUND CARE) ×2 IMPLANT
CORDS BIPOLAR (ELECTRODE) IMPLANT
COVER SURGICAL LIGHT HANDLE (MISCELLANEOUS) ×3 IMPLANT
CUFF TOURNIQUET SINGLE 24IN (TOURNIQUET CUFF) IMPLANT
CUFF TOURNIQUET SINGLE 34IN LL (TOURNIQUET CUFF) ×6 IMPLANT
CUFF TOURNIQUET SINGLE 44IN (TOURNIQUET CUFF) IMPLANT
DRAPE EXTREMITY BILATERAL (DRAPES) IMPLANT
DRAPE IMP U-DRAPE 54X76 (DRAPES) IMPLANT
DRAPE INCISE IOBAN 66X45 STRL (DRAPES) ×12 IMPLANT
DRAPE SURG 17X23 STRL (DRAPES) IMPLANT
DRAPE U-SHAPE 47X51 STRL (DRAPES) ×3 IMPLANT
DRSG PAD ABDOMINAL 8X10 ST (GAUZE/BANDAGES/DRESSINGS) ×2 IMPLANT
DRSG VAC ATS SM SENSATRAC (GAUZE/BANDAGES/DRESSINGS) ×2 IMPLANT
DURAPREP 26ML APPLICATOR (WOUND CARE) ×3 IMPLANT
ELECT CAUTERY BLADE 6.4 (BLADE) ×3 IMPLANT
ELECT REM PT RETURN 9FT ADLT (ELECTROSURGICAL)
ELECTRODE REM PT RTRN 9FT ADLT (ELECTROSURGICAL) IMPLANT
FACESHIELD WRAPAROUND (MASK) IMPLANT
FACESHIELD WRAPAROUND OR TEAM (MASK) IMPLANT
GAUZE SPONGE 4X4 12PLY STRL (GAUZE/BANDAGES/DRESSINGS) ×6 IMPLANT
GAUZE VASELINE 3X9 (GAUZE/BANDAGES/DRESSINGS) ×4 IMPLANT
GAUZE XEROFORM 1X8 LF (GAUZE/BANDAGES/DRESSINGS) ×5 IMPLANT
GAUZE XEROFORM 5X9 LF (GAUZE/BANDAGES/DRESSINGS) ×3 IMPLANT
GLOVE SKINSENSE NS SZ7.5 (GLOVE) ×4
GLOVE SKINSENSE STRL SZ7.5 (GLOVE) ×2 IMPLANT
GOWN STRL REIN XL XLG (GOWN DISPOSABLE) ×6 IMPLANT
HANDPIECE INTERPULSE COAX TIP (DISPOSABLE)
KIT BASIN OR (CUSTOM PROCEDURE TRAY) ×3 IMPLANT
KIT ROOM TURNOVER OR (KITS) ×3 IMPLANT
MANIFOLD NEPTUNE II (INSTRUMENTS) ×3 IMPLANT
NS IRRIG 1000ML POUR BTL (IV SOLUTION) ×6 IMPLANT
PACK ORTHO EXTREMITY (CUSTOM PROCEDURE TRAY) ×3 IMPLANT
PAD ABD 8X10 STRL (GAUZE/BANDAGES/DRESSINGS) ×3 IMPLANT
PAD ARMBOARD 7.5X6 YLW CONV (MISCELLANEOUS) ×6 IMPLANT
PAD CAST 4YDX4 CTTN HI CHSV (CAST SUPPLIES) IMPLANT
PADDING CAST ABS 4INX4YD NS (CAST SUPPLIES) ×4
PADDING CAST ABS COTTON 4X4 ST (CAST SUPPLIES) ×2 IMPLANT
PADDING CAST COTTON 4X4 STRL (CAST SUPPLIES) ×3
PADDING CAST COTTON 6X4 STRL (CAST SUPPLIES) ×3 IMPLANT
SET HNDPC FAN SPRY TIP SCT (DISPOSABLE) IMPLANT
SPONGE GAUZE 4X4 12PLY STER LF (GAUZE/BANDAGES/DRESSINGS) ×2 IMPLANT
SPONGE LAP 18X18 X RAY DECT (DISPOSABLE) ×6 IMPLANT
STOCKINETTE IMPERVIOUS 9X36 MD (GAUZE/BANDAGES/DRESSINGS) ×3 IMPLANT
SUT ETHILON 2 0 FS 18 (SUTURE) ×9 IMPLANT
SUT ETHILON 2 0 PSLX (SUTURE) ×3 IMPLANT
SUT ETHILON 3 0 PS 1 (SUTURE) ×6 IMPLANT
SUT VIC AB 2-0 CT1 36 (SUTURE) ×3 IMPLANT
SUT VIC AB 2-0 FS1 27 (SUTURE) ×6 IMPLANT
SYR CONTROL 10ML LL (SYRINGE) IMPLANT
TOWEL OR 17X24 6PK STRL BLUE (TOWEL DISPOSABLE) ×3 IMPLANT
TOWEL OR 17X26 10 PK STRL BLUE (TOWEL DISPOSABLE) ×3 IMPLANT
TUBE ANAEROBIC SPECIMEN COL (MISCELLANEOUS) IMPLANT
TUBE CONNECTING 12'X1/4 (SUCTIONS) ×1
TUBE CONNECTING 12X1/4 (SUCTIONS) ×2 IMPLANT
TUBE FEEDING 5FR 15 INCH (TUBING) IMPLANT
TUBING CYSTO DISP (UROLOGICAL SUPPLIES) ×3 IMPLANT
UNDERPAD 30X30 INCONTINENT (UNDERPADS AND DIAPERS) ×6 IMPLANT
WATER STERILE IRR 1000ML POUR (IV SOLUTION) ×3 IMPLANT
YANKAUER SUCT BULB TIP NO VENT (SUCTIONS) ×3 IMPLANT

## 2015-06-01 NOTE — H&P (Signed)
H&P update  The surgical history has been reviewed and remains accurate without interval change.  The patient was re-examined and patient's physiologic condition has not changed significantly in the last 30 days. The condition still exists that makes this procedure necessary. The treatment plan remains the same, without new options for care.  No new pharmacological allergies or types of therapy has been initiated that would change the plan or the appropriateness of the plan.  The patient and/or family understand the potential benefits and risks.  Mayra Reel, MD 06/01/2015 6:59 AM

## 2015-06-01 NOTE — Anesthesia Preprocedure Evaluation (Signed)
Anesthesia Evaluation  Patient identified by MRN, date of birth, ID band Patient awake    Reviewed: Allergy & Precautions, NPO status , Patient's Chart, lab work & pertinent test results  Airway Mallampati: II  TM Distance: >3 FB Neck ROM: Full    Dental  (+) Poor Dentition, Missing, Chipped,    Pulmonary Current Smoker,    breath sounds clear to auscultation       Cardiovascular negative cardio ROS   Rhythm:Regular Rate:Normal     Neuro/Psych Seizures -,  negative psych ROS   GI/Hepatic negative GI ROS, Neg liver ROS,   Endo/Other  negative endocrine ROS  Renal/GU negative Renal ROS  negative genitourinary   Musculoskeletal negative musculoskeletal ROS (+)   Abdominal   Peds negative pediatric ROS (+)  Hematology negative hematology ROS (+)   Anesthesia Other Findings   Reproductive/Obstetrics negative OB ROS                             Lab Results  Component Value Date   WBC 8.9 05/29/2015   HGB 10.6* 05/29/2015   HCT 31.1* 05/29/2015   MCV 86.9 05/29/2015   PLT 158 05/29/2015   Lab Results  Component Value Date   CREATININE 0.90 05/29/2015   BUN 14 05/29/2015   NA 135 05/29/2015   K 3.8 05/29/2015   CL 106 05/29/2015   CO2 22 05/29/2015   Lab Results  Component Value Date   INR 1.13 05/27/2015     Anesthesia Physical Anesthesia Plan  ASA: II  Anesthesia Plan: General   Post-op Pain Management:    Induction: Intravenous  Airway Management Planned: LMA  Additional Equipment:   Intra-op Plan:   Post-operative Plan: Extubation in OR  Informed Consent: I have reviewed the patients History and Physical, chart, labs and discussed the procedure including the risks, benefits and alternatives for the proposed anesthesia with the patient or authorized representative who has indicated his/her understanding and acceptance.   Dental advisory given  Plan Discussed  with: CRNA  Anesthesia Plan Comments:         Anesthesia Quick Evaluation

## 2015-06-01 NOTE — Op Note (Signed)
   Date of Surgery: 06/01/2015  INDICATIONS: Mr. Corbo is a 60 y.o.-year-old male with a left knee wound who returns today for serial debridement and possible closure;  The patient did consent to the procedure after discussion of the risks and benefits.  PREOPERATIVE DIAGNOSIS: Left knee wound   POSTOPERATIVE DIAGNOSIS: Same.  PROCEDURE: Excisional debridement of skin, subcutaneous tissue, muscle of left knee 150 sq cm  SURGEON: N. Glee Arvin, M.D.  ASSIST: none.  ANESTHESIA:  general  IV FLUIDS AND URINE: See anesthesia.  ESTIMATED BLOOD LOSS: minimal mL.  IMPLANTS: none  DRAINS: wound vac  COMPLICATIONS: None.  DESCRIPTION OF PROCEDURE: The patient was brought to the operating room and placed supine on the operating table.  The patient had been signed prior to the procedure and this was documented. The patient had the anesthesia placed by the anesthesiologist.  A time-out was performed to confirm that this was the correct patient, site, side and location. The patient did receive antibiotics prior to the incision and was re-dosed during the procedure as needed at indicated intervals.  A tourniquet not placed.  The patient had the operative extremity prepped and draped in the standard surgical fashion.    Sharp excisional debridement of nonviable skin, subcutaneous tissue, muscle was performed with a knife and rongeur. Everything was debrided back to bleeding surface. This was then thoroughly irrigated. The total wound measured 150 cm. I was not able to close the wound. There is no gross contamination. I then placed a wound VAC and turned to -100 mmHg. Patient tolerated the procedure well and no immediate complications.  POSTOPERATIVE PLAN: Patient will need to go home with Monday Wednesday Friday wound VAC changes. He will also need to be placed on 2 weeks of by mouth Keflex 500 mg 4 times a day.  Mayra Reel, MD [employer] [phone] 1:45 PM

## 2015-06-01 NOTE — Clinical Social Work Note (Signed)
Clinical Social Worker continuing to follow patient for support and discharge planning needs.  Patient currently in the OR, however received notification that patient will need SNF placement at discharge.  CSW aware that patient will have a wound vac at discharge and due to the absence of insurance coverage will be a difficult to place patient.  Patient has been accepted to [facility] for Monday 02/27.  CSW unable to update patient due to still being in the OR but will follow up over the weekend to provide discharge plans.  CSW available for support as needed.  Macario Golds, [place] [phone]

## 2015-06-01 NOTE — Progress Notes (Signed)
Orthopedic Tech Progress Note Patient Details:  Stephen Juarez 03/19/1956 [id number]  Ortho Devices Type of Ortho Device: CAM walker Ortho Device/Splint Interventions: Ordered As ordered by Dr. Minette Brine, [name] 06/01/2015, 12:39 PM
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
## 2015-06-01 NOTE — Progress Notes (Signed)
PT Cancellation Note  Patient Details Name: Stephen Juarez MRN: [id number] DOB: 08-May-1955   Cancelled Treatment:    Reason Eval/Treat Not Completed: Other (comment) (pt refused mobility until after surgery today. Will attempt next date)   Delorse Lek 06/01/2015, 8:44 AM Delaney Meigs, PT [phone]
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
## 2015-06-01 NOTE — Transfer of Care (Signed)
Immediate Anesthesia Transfer of Care Note  Patient: Stephen Juarez  Procedure(s) Performed: Procedure(s): IRRIGATION AND DEBRIDEMENT Left Knee (Left)  Patient Location: PACU  Anesthesia Type:General  Level of Consciousness: awake, alert , oriented and patient cooperative  Airway & Oxygen Therapy: Patient Spontanous Breathing and Patient connected to nasal cannula oxygen  Post-op Assessment: Report given to RN and Post -op Vital signs reviewed and stable  Post vital signs: Reviewed and stable  Last Vitals:  Filed Vitals:   06/01/15 1117 06/01/15 1340  BP: 102/71 117/82  Pulse: 83 76  Temp: 36.9 C 36.7 C  Resp: 18 14    Complications: No apparent anesthesia complications

## 2015-06-01 NOTE — Progress Notes (Signed)
OT Cancellation    06/01/15 1300  OT Visit Information  Last OT Received On 06/01/15  Reason Eval/Treat Not Completed Patient at procedure or test/ unavailable (OR today)  [facility], OTR/L  [phone] 06/01/2015
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
## 2015-06-02 NOTE — Progress Notes (Signed)
OT Cancellation Note  Patient Details Name: Stephen Juarez MRN: [id number] DOB: [birth year]   Cancelled Treatment:    Reason Eval/Treat Not Completed: Other (comment) Pt to D/C to [facility] 2/27. If OT eval needed for admission please call [phone] or page OT at [phone]. thanks [facility] Loreen Bankson, OTR/L  [phone] 06/02/2015 06/02/2015, 3:50 PM

## 2015-06-02 NOTE — Clinical Social Work Note (Signed)
Clinical Social Worker continuing to follow patient for support and discharge planning needs.  Patient has a bed available at [facility] on Monday and will discharge with the wound vac.  Patient is agreeable with discharge plan and will update the shelter of his current plans so that he will be able to return after SNF stay.  CSW remains available for support and to facilitate patient discharge needs.  Stephen Juarez, [place] [phone]
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
## 2015-06-04 ENCOUNTER — Encounter (HOSPITAL_COMMUNITY): Payer: Self-pay | Admitting: Orthopaedic Surgery

## 2015-06-04 MED ORDER — OXYCODONE-ACETAMINOPHEN 10-325 MG PO TABS: 1.0000 | ORAL_TABLET | ORAL | Status: DC | PRN

## 2015-06-04 MED ORDER — OXYCODONE HCL ER 10 MG PO T12A: 10.0000 mg | EXTENDED_RELEASE_TABLET | Freq: Two times a day (BID) | ORAL | Status: DC

## 2015-06-04 MED ORDER — POLYETHYLENE GLYCOL 3350 17 G PO PACK: 17.0000 g | PACK | Freq: Every day | ORAL | Status: DC

## 2015-06-04 MED ORDER — METHOCARBAMOL 750 MG PO TABS
1500.0000 mg | ORAL_TABLET | Freq: Three times a day (TID) | ORAL | Status: DC
  Administered 2015-06-04: 1500 mg via ORAL
  Filled 2015-06-04: qty 2

## 2015-06-04 MED ORDER — DOCUSATE SODIUM 100 MG PO CAPS: 200.0000 mg | ORAL_CAPSULE | Freq: Two times a day (BID) | ORAL | Status: DC

## 2015-06-04 MED ORDER — METHOCARBAMOL 500 MG PO TABS: 1000.0000 mg | ORAL_TABLET | Freq: Four times a day (QID) | ORAL | Status: DC | PRN

## 2015-06-04 NOTE — Progress Notes (Signed)
Called an gave report to [facility] facility.
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
## 2015-06-04 NOTE — Anesthesia Postprocedure Evaluation (Signed)
Anesthesia Post Note  Patient: Stephen Juarez  Procedure(s) Performed: Procedure(s) (LRB): IRRIGATION AND DEBRIDEMENT Left Knee (Left)  Patient location during evaluation: PACU Anesthesia Type: General Level of consciousness: awake and alert Pain management: pain level controlled Vital Signs Assessment: post-procedure vital signs reviewed and stable Respiratory status: spontaneous breathing, nonlabored ventilation, respiratory function stable and patient connected to nasal cannula oxygen Cardiovascular status: blood pressure returned to baseline and stable Postop Assessment: no signs of nausea or vomiting Anesthetic complications: no    Last Vitals:  Filed Vitals:   06/03/15 2222 06/04/15 1300  BP: 102/58 110/72  Pulse: 80 82  Temp: 36.7 C 36.6 C  Resp:  18    Last Pain:  Filed Vitals:   06/04/15 1300  PainSc: 3                  Shelton Silvas
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
## 2015-06-04 NOTE — Clinical Social Work Placement (Signed)
   CLINICAL SOCIAL WORK PLACEMENT  NOTE  Date:  06/04/2015  Patient Details  Name: Stephen Juarez MRN: [id number] Date of Birth: [birth year]  Clinical Social Work is seeking post-discharge placement for this patient at the Skilled  Nursing Facility level of care (*CSW will initial, date and re-position this form in  chart as items are completed):  Yes   Patient/family provided with [facility] Clinical Social Work Department's list of facilities offering this level of care within the geographic area requested by the patient (or if unable, by the patient's family).  Yes   Patient/family informed of their freedom to choose among providers that offer the needed level of care, that participate in Medicare, Medicaid or managed care program needed by the patient, have an available bed and are willing to accept the patient.  Yes   Patient/family informed of [facility]'s ownership interest in [facility] and [facility], as well as of the fact that they are under no obligation to receive care at these facilities.  PASRR submitted to EDS on 06/01/15     PASRR number received on 06/01/15     Existing PASRR number confirmed on       FL2 transmitted to all facilities in geographic area requested by pt/family on 06/01/15     FL2 transmitted to all facilities within larger geographic area on       Patient informed that his/her managed care company has contracts with or will negotiate with certain facilities, including the following:        Yes   Patient/family informed of bed offers received.  Patient chooses bed at [facility]     Physician recommends and patient chooses bed at      Patient to be transferred to [facility] on 06/04/15.  Patient to be transferred to facility by Ambulance     Patient family notified on 06/04/15 of transfer.  Name of family member notified:  Patient has notified his daughter     PHYSICIAN Please prepare priority  discharge summary, including medications, Please prepare prescriptions, Please sign FL2     Additional Comment:  Per MD patient ready for DC to [facility]. RN, patient, patient's family, and facility notified of DC. RN given number for report. DC packet on chart. Ambulance transport requested for patient for 3:45PMper RN's request to allow time for wound vac dressing change. CSW signing off.   _______________________________________________ Venita Lick, LCSW 06/04/2015, 2:16 PM

## 2015-06-04 NOTE — Progress Notes (Signed)
Patient is scheduled for repeat I&D and STSG on Wednesday.  If he goes to SNF, we will bring him back for outpatient surgery.  Mayra Reel, MD [facility] [phone] 8:58 AM

## 2015-06-05 ENCOUNTER — Encounter (HOSPITAL_COMMUNITY): Payer: Self-pay | Admitting: *Deleted

## 2015-06-05 NOTE — Pre-Procedure Instructions (Signed)
    Willem Klingensmith  06/05/2015     Your procedure is scheduled on Wednesday, March 1   Report to [facility] Admitting at 12:45 P.M.   Call this number if you have problems the morning of [phone]   Remember:  Do not eat food or drink liquids after midnight.  Take these medicines the morning of surgery with A SIP OFWATERoxyCODONE-acetaminophen (PERCOCET)      Do not wear jewelry, make-up or nail polish.  Do not wear lotions, powders, or perfumes.     Men may shave face and neck.  Do not bring valuables to the hospital.  [facility] is not responsible for any belongings or valuables.

## 2015-06-05 NOTE — Progress Notes (Signed)
I spoke with Ronnald Ramp, RN at [facility]. m gave instructions to [facility] and fax instructions to her.

## 2015-06-06 ENCOUNTER — Encounter: Payer: Self-pay | Admitting: Internal Medicine

## 2015-06-06 ENCOUNTER — Encounter (HOSPITAL_COMMUNITY): Payer: Self-pay | Admitting: Certified Registered Nurse Anesthetist

## 2015-06-06 ENCOUNTER — Ambulatory Visit (HOSPITAL_COMMUNITY)
Admission: RE | Admit: 2015-06-06 | Discharge: 2015-06-06 | Disposition: A | Discharge status: Skilled Nursing Facility | Payer: No Typology Code available for payment source | Source: Ambulatory Visit | Attending: Orthopaedic Surgery | Admitting: Orthopaedic Surgery

## 2015-06-06 ENCOUNTER — Encounter: Payer: Self-pay | Admitting: *Deleted

## 2015-06-06 ENCOUNTER — Non-Acute Institutional Stay (SKILLED_NURSING_FACILITY): Payer: Self-pay | Admitting: Internal Medicine

## 2015-06-06 ENCOUNTER — Other Ambulatory Visit: Payer: Self-pay | Admitting: Orthopaedic Surgery

## 2015-06-06 DIAGNOSIS — Z539 Procedure and treatment not carried out, unspecified reason: Secondary | ICD-10-CM | POA: Insufficient documentation

## 2015-06-06 DIAGNOSIS — M62830 Muscle spasm of back: Secondary | ICD-10-CM

## 2015-06-06 DIAGNOSIS — F17210 Nicotine dependence, cigarettes, uncomplicated: Secondary | ICD-10-CM | POA: Insufficient documentation

## 2015-06-06 DIAGNOSIS — S62102D Fracture of unspecified carpal bone, left wrist, subsequent encounter for fracture with routine healing: Secondary | ICD-10-CM

## 2015-06-06 DIAGNOSIS — Z7289 Other problems related to lifestyle: Secondary | ICD-10-CM

## 2015-06-06 DIAGNOSIS — D62 Acute posthemorrhagic anemia: Secondary | ICD-10-CM

## 2015-06-06 DIAGNOSIS — K5901 Slow transit constipation: Secondary | ICD-10-CM

## 2015-06-06 DIAGNOSIS — S81002D Unspecified open wound, left knee, subsequent encounter: Secondary | ICD-10-CM

## 2015-06-06 DIAGNOSIS — F1090 Alcohol use, unspecified, uncomplicated: Secondary | ICD-10-CM

## 2015-06-06 DIAGNOSIS — X58XXXA Exposure to other specified factors, initial encounter: Secondary | ICD-10-CM | POA: Insufficient documentation

## 2015-06-06 DIAGNOSIS — R569 Unspecified convulsions: Secondary | ICD-10-CM

## 2015-06-06 DIAGNOSIS — Z789 Other specified health status: Secondary | ICD-10-CM

## 2015-06-06 DIAGNOSIS — S32028D Other fracture of second lumbar vertebra, subsequent encounter for fracture with routine healing: Secondary | ICD-10-CM

## 2015-06-06 DIAGNOSIS — S82892A Other fracture of left lower leg, initial encounter for closed fracture: Secondary | ICD-10-CM

## 2015-06-06 DIAGNOSIS — S81002A Unspecified open wound, left knee, initial encounter: Secondary | ICD-10-CM | POA: Insufficient documentation

## 2015-06-06 DIAGNOSIS — Z79891 Long term (current) use of opiate analgesic: Secondary | ICD-10-CM | POA: Insufficient documentation

## 2015-06-06 HISTORY — DX: Unspecified fracture of unspecified lumbar vertebra, initial encounter for closed fracture: S32.009A

## 2015-06-06 MED ORDER — LACTATED RINGERS IV SOLN: INTRAVENOUS | Status: DC

## 2015-06-06 MED ORDER — LIDOCAINE HCL (CARDIAC) 20 MG/ML IV SOLN
INTRAVENOUS | Status: AC
  Filled 2015-06-06: qty 5

## 2015-06-06 MED ORDER — PROPOFOL 10 MG/ML IV BOLUS
INTRAVENOUS | Status: AC
  Filled 2015-06-06: qty 20

## 2015-06-06 MED ORDER — ONDANSETRON HCL 4 MG/2ML IJ SOLN
INTRAMUSCULAR | Status: AC
  Filled 2015-06-06: qty 2

## 2015-06-06 MED ORDER — FENTANYL CITRATE (PF) 250 MCG/5ML IJ SOLN
INTRAMUSCULAR | Status: AC
  Filled 2015-06-06: qty 5

## 2015-06-06 MED ORDER — MIDAZOLAM HCL 2 MG/2ML IJ SOLN
INTRAMUSCULAR | Status: AC
  Filled 2015-06-06: qty 2

## 2015-06-06 NOTE — Anesthesia Preprocedure Evaluation (Deleted)
Anesthesia Evaluation    Reviewed: Allergy & Precautions, NPO status , Patient's Chart, lab work & pertinent test results  Airway        Dental  (+) Dental Advisory Given   Pulmonary Current Smoker,           Cardiovascular      Neuro/Psych Seizures -,     GI/Hepatic   Endo/Other    Renal/GU      Musculoskeletal   Abdominal   Peds  Hematology   Anesthesia Other Findings   Reproductive/Obstetrics                             Anesthesia Physical Anesthesia Plan  ASA: II  Anesthesia Plan: General   Post-op Pain Management:    Induction: Intravenous  Airway Management Planned:   Additional Equipment:   Intra-op Plan:   Post-operative Plan:   Informed Consent: I have reviewed the patients History and Physical, chart, labs and discussed the procedure including the risks, benefits and alternatives for the proposed anesthesia with the patient or authorized representative who has indicated his/her understanding and acceptance.   Dental advisory given  Plan Discussed with: CRNA, Anesthesiologist and Surgeon  Anesthesia Plan Comments:         Anesthesia Quick Evaluation

## 2015-06-06 NOTE — Progress Notes (Deleted)
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 air cells. The remaining visualized paranasal sinuses and mastoid air cells are well-aerated. Soft tissue swelling is noted overlying the left maxilla. The parapharyngeal fat planes are preserved. The nasopharynx, oropharynx and hypopharynx are unremarkable in appearance. The visualized portions of the valleculae and piriform sinuses are grossly unremarkable. The parotid and submandibular glands are within normal limits. No cervical lymphadenopathy is seen. CT CERVICAL SPINE FINDINGS There is no evidence of fracture or subluxation. There is slight chronic loss of height at vertebral body C4, and multilevel disc space narrowing is noted along the cervical spine, with scattered anterior and posterior disc osteophyte complexes. Prevertebral soft tissues are within normal limits. The thyroid gland is unremarkable in appearance. The visualized lung apices are clear. No significant soft tissue abnormalities are seen. IMPRESSION: 1. No evidence of traumatic intracranial injury or fracture. 2. No evidence of fracture or  dislocation with regard to the maxillofacial structures. 3. No evidence of fracture or subluxation along the cervical spine. 4. Soft tissue swelling overlying the high left frontoparietal calvarium. Soft tissue swelling overlying the left maxilla. 5. 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 6. Mucosal thickening at the maxillary sinuses bilaterally. Chronic deformity involving the left frontal sinus. 7. Mild degenerative change along the cervical spine, with slight chronic loss of height at C4. Electronically Signed   By: Roanna Raider M.D.   On: 05/28/2015 01:18    Assessment/Plan There are no diagnoses linked to this encounter.   Family/ staff Communication:   Labs/tests ordered:

## 2015-06-06 NOTE — Progress Notes (Signed)
Pt did not have his surgery done today, sent back to [facility], to have surgery tomorrow. Called and spoke with Etter Sjogren, RN at [facility]. Gave her new pre-op instructions for surgery for tomorrow. She voiced understanding.

## 2015-06-06 NOTE — H&P (Signed)
PREOPERATIVE H&P  Chief Complaint: left knee wound  HPI: Stephen Juarez is a 59 y.o. male who presents for surgical treatment of left knee wound.  He denies any changes in medical history.  Past Medical History  Diagnosis Date  . Seizures (HCC)   . Lumbar vertebral fracture (HCC) 2/    L2   Past Surgical History  Procedure Laterality Date  . Orif ankle fracture Left 05/2015  . Orif wrist fracture Left 05/2015  . Orif ankle fracture Left 05/30/2015    Procedure: OPEN REDUCTION INTERNAL FIXATION (ORIF) LEFT ANKLE FRACTURE;  Surgeon: Tarry Kos, MD;  Location: [facility];  Service: Orthopedics;  Laterality: Left;  . Open reduction internal fixation (orif) distal radial fracture Left 05/30/2015    Procedure: OPEN REDUCTION INTERNAL FIXATION (ORIF) LEFT DISTAL RADIUS FRACTURE;  Surgeon: Tarry Kos, MD;  Location: [facility] OR;  Service: Orthopedics;  Laterality: Left;  . Irrigation and debridement knee Left 05/30/2015    Procedure: IRRIGATION AND DEBRIDEMENT LEFT KNEE;  Surgeon: Tarry Kos, MD;  Location: [facility];  Service: Orthopedics;  Laterality: Left;  . I&d extremity Left 06/01/2015    Procedure: IRRIGATION AND DEBRIDEMENT Left Knee;  Surgeon: Tarry Kos, MD;  Location: [facility];  Service: Orthopedics;  Laterality: Left;   Social History   Social History  . Marital Status: Single    Spouse Name: N/A  . Number of Children: N/A  . Years of Education: N/A   Social History Main Topics  . Smoking status: Current Every Day Smoker -- 1.00 packs/day for 32 years    Types: Cigarettes  . Smokeless tobacco: Never Used  . Alcohol Use: Yes     Comment: OCCASIONAL  . Drug Use: No     Comment: " IN THE PAST "  . Sexual Activity: Not on file   Other Topics Concern  . Not on file   Social History Narrative   No family history on file. No Known Allergies Prior to Admission medications   Medication Sig Start Date End Date Taking? Authorizing Provider  docusate sodium (COLACE) 100 MG  capsule Take 2 capsules (200 mg total) by mouth 2 (two) times daily. 06/04/15   Freeman Caldron, PA-C  methocarbamol (ROBAXIN) 500 MG tablet Take 2 tablets (1,000 mg total) by mouth every 6 (six) hours as needed for muscle spasms. 06/04/15   Freeman Caldron, PA-C  oxyCODONE (OXYCONTIN) 10 mg 12 hr tablet Take 1 tablet (10 mg total) by mouth every 12 (twelve) hours. 06/04/15   Freeman Caldron, PA-C  oxyCODONE-acetaminophen (PERCOCET) 10-325 MG tablet Take 1-2 tablets by mouth every 4 (four) hours as needed for pain. 06/04/15   Freeman Caldron, PA-C  polyethylene glycol [facility] Ethelene Hal) packet Take 17 g by mouth daily. 06/04/15   Freeman Caldron, PA-C     Positive ROS: All other systems have been reviewed and were otherwise negative with the exception of those mentioned in the HPI and as above.  Physical Exam: General: Alert, no acute distress Cardiovascular: No pedal edema Respiratory: No cyanosis, no use of accessory musculature GI: abdomen soft Skin: No lesions in the area of chief complaint Neurologic: Sensation intact distally Psychiatric: Patient is competent for consent with normal mood and affect Lymphatic: no lymphedema  MUSCULOSKELETAL: exam stable  Assessment: left knee wound  Plan: Plan for Procedure(s): IRRIGATION AND DEBRIDEMENT LEFT KNEE WITH SPLIT THICKNESS SKIN GRAFT SKIN GRAFT SPLIT THICKNESS  The risks benefits and alternatives were  discussed with the patient including but not limited to the risks of nonoperative treatment, versus surgical intervention including infection, bleeding, nerve injury,  blood clots, cardiopulmonary complications, morbidity, mortality, among others, and they were willing to proceed.   Cheral Almas, MD   06/06/2015 7:49 AM

## 2015-06-07 ENCOUNTER — Ambulatory Visit (HOSPITAL_COMMUNITY)
Admission: RE | Admit: 2015-06-07 | Discharge: 2015-06-07 | Disposition: A | Discharge status: Home / Self Care | Payer: No Typology Code available for payment source | Source: Ambulatory Visit | Attending: Orthopaedic Surgery | Admitting: Orthopaedic Surgery

## 2015-06-07 ENCOUNTER — Encounter (HOSPITAL_COMMUNITY): Payer: Self-pay | Admitting: *Deleted

## 2015-06-07 ENCOUNTER — Encounter (HOSPITAL_COMMUNITY)
Admission: RE | Disposition: A | Discharge status: Home / Self Care | Payer: Self-pay | Source: Ambulatory Visit | Attending: Orthopaedic Surgery

## 2015-06-07 ENCOUNTER — Encounter (HOSPITAL_COMMUNITY)
Admission: RE | Disposition: A | Discharge status: Skilled Nursing Facility | Payer: Self-pay | Source: Ambulatory Visit | Attending: Orthopaedic Surgery

## 2015-06-07 ENCOUNTER — Ambulatory Visit (HOSPITAL_COMMUNITY): Payer: No Typology Code available for payment source | Admitting: Anesthesiology

## 2015-06-07 DIAGNOSIS — S81002A Unspecified open wound, left knee, initial encounter: Secondary | ICD-10-CM | POA: Diagnosis not present

## 2015-06-07 DIAGNOSIS — F17210 Nicotine dependence, cigarettes, uncomplicated: Secondary | ICD-10-CM | POA: Insufficient documentation

## 2015-06-07 DIAGNOSIS — X58XXXA Exposure to other specified factors, initial encounter: Secondary | ICD-10-CM | POA: Diagnosis not present

## 2015-06-07 HISTORY — PX: SKIN SPLIT GRAFT: SHX444

## 2015-06-07 HISTORY — PX: IRRIGATION AND DEBRIDEMENT KNEE: SHX5185

## 2015-06-07 SURGERY — IRRIGATION AND DEBRIDEMENT KNEE
Anesthesia: General | Laterality: Left

## 2015-06-07 SURGERY — IRRIGATION AND DEBRIDEMENT KNEE
Anesthesia: General | Site: Leg Upper | Laterality: Left

## 2015-06-07 MED ORDER — PROMETHAZINE HCL 25 MG/ML IJ SOLN: 6.2500 mg | INTRAMUSCULAR | Status: DC | PRN

## 2015-06-07 MED ORDER — HYDROMORPHONE HCL 1 MG/ML IJ SOLN: 0.2500 mg | INTRAMUSCULAR | Status: DC | PRN

## 2015-06-07 MED ORDER — ONDANSETRON HCL 4 MG/2ML IJ SOLN
INTRAMUSCULAR | Status: DC | PRN
  Administered 2015-06-07: 4 mg via INTRAVENOUS

## 2015-06-07 MED ORDER — CEFAZOLIN SODIUM-DEXTROSE 2-3 GM-% IV SOLR
INTRAVENOUS | Status: AC
  Administered 2015-06-07: 2 g via INTRAVENOUS
  Filled 2015-06-07: qty 50

## 2015-06-07 MED ORDER — PHENYLEPHRINE HCL 10 MG/ML IJ SOLN
INTRAMUSCULAR | Status: DC | PRN
  Administered 2015-06-07: 40 ug via INTRAVENOUS
  Administered 2015-06-07: 80 ug via INTRAVENOUS

## 2015-06-07 MED ORDER — DEXAMETHASONE SODIUM PHOSPHATE 4 MG/ML IJ SOLN
INTRAMUSCULAR | Status: AC
  Filled 2015-06-07: qty 1

## 2015-06-07 MED ORDER — CEFAZOLIN SODIUM-DEXTROSE 2-3 GM-% IV SOLR: 2.0000 g | INTRAVENOUS | Status: DC

## 2015-06-07 MED ORDER — EPINEPHRINE HCL 1 MG/ML IJ SOLN
INTRAMUSCULAR | Status: AC
  Filled 2015-06-07: qty 1

## 2015-06-07 MED ORDER — LIDOCAINE HCL (CARDIAC) 20 MG/ML IV SOLN
INTRAVENOUS | Status: DC | PRN
  Administered 2015-06-07: 80 mg via INTRAVENOUS

## 2015-06-07 MED ORDER — MINERAL OIL LIGHT 100 % EX OIL
TOPICAL_OIL | CUTANEOUS | Status: DC | PRN
  Administered 2015-06-07: 1 via TOPICAL

## 2015-06-07 MED ORDER — SODIUM CHLORIDE 0.9 % IR SOLN
Status: DC | PRN
  Administered 2015-06-07: 1000 mL
  Administered 2015-06-07: 3000 mL

## 2015-06-07 MED ORDER — PROPOFOL 10 MG/ML IV BOLUS
INTRAVENOUS | Status: DC | PRN
  Administered 2015-06-07: 200 mg via INTRAVENOUS

## 2015-06-07 MED ORDER — HYDROCODONE-ACETAMINOPHEN 7.5-325 MG PO TABS
ORAL_TABLET | ORAL | Status: AC
  Filled 2015-06-07: qty 1

## 2015-06-07 MED ORDER — MIDAZOLAM HCL 2 MG/2ML IJ SOLN
INTRAMUSCULAR | Status: AC
  Filled 2015-06-07: qty 2

## 2015-06-07 MED ORDER — FENTANYL CITRATE (PF) 250 MCG/5ML IJ SOLN
INTRAMUSCULAR | Status: AC
  Filled 2015-06-07: qty 5

## 2015-06-07 MED ORDER — LACTATED RINGERS IV SOLN
INTRAVENOUS | Status: DC
  Administered 2015-06-07 (×2): via INTRAVENOUS

## 2015-06-07 MED ORDER — HYDROCODONE-ACETAMINOPHEN 7.5-325 MG PO TABS
1.0000 | ORAL_TABLET | Freq: Once | ORAL | Status: AC | PRN
  Administered 2015-06-07: 1 via ORAL

## 2015-06-07 MED ORDER — DEXAMETHASONE SODIUM PHOSPHATE 4 MG/ML IJ SOLN
INTRAMUSCULAR | Status: DC | PRN
  Administered 2015-06-07: 4 mg via INTRAVENOUS

## 2015-06-07 MED ORDER — FENTANYL CITRATE (PF) 100 MCG/2ML IJ SOLN
INTRAMUSCULAR | Status: DC | PRN
  Administered 2015-06-07 (×2): 25 ug via INTRAVENOUS
  Administered 2015-06-07 (×2): 50 ug via INTRAVENOUS
  Administered 2015-06-07: 25 ug via INTRAVENOUS

## 2015-06-07 MED ORDER — MINERAL OIL LIGHT 100 % EX OIL
TOPICAL_OIL | CUTANEOUS | Status: AC
  Filled 2015-06-07: qty 25

## 2015-06-07 MED ORDER — MIDAZOLAM HCL 5 MG/5ML IJ SOLN
INTRAMUSCULAR | Status: DC | PRN
  Administered 2015-06-07: 2 mg via INTRAVENOUS

## 2015-06-07 MED ORDER — EPINEPHRINE HCL 1 MG/ML IJ SOLN
INTRAMUSCULAR | Status: DC | PRN
  Administered 2015-06-07: 1 mg via SUBCUTANEOUS

## 2015-06-07 SURGICAL SUPPLY — 57 items
BANDAGE ELASTIC 4 VELCRO ST LF (GAUZE/BANDAGES/DRESSINGS) ×1 IMPLANT
BANDAGE ELASTIC 6 VELCRO ST LF (GAUZE/BANDAGES/DRESSINGS) IMPLANT
BLADE DERMATOME SS (BLADE) ×3 IMPLANT
BLADE SURG ROTATE 9660 (MISCELLANEOUS) ×1 IMPLANT
BNDG COHESIVE 6X5 TAN STRL LF (GAUZE/BANDAGES/DRESSINGS) ×4 IMPLANT
BNDG GAUZE ELAST 4 BULKY (GAUZE/BANDAGES/DRESSINGS) IMPLANT
CANISTER SUCTION 2500CC (MISCELLANEOUS) IMPLANT
COVER SURGICAL LIGHT HANDLE (MISCELLANEOUS) ×2 IMPLANT
CUFF TOURNIQUET SINGLE 34IN LL (TOURNIQUET CUFF) IMPLANT
CUFF TOURNIQUET SINGLE 44IN (TOURNIQUET CUFF) ×2 IMPLANT
DEPRESSOR TONGUE BLADE STERILE (MISCELLANEOUS) ×2 IMPLANT
DERMACARRIERS GRAFT 1 TO 1.5 (DISPOSABLE) ×3
DRAPE IMP U-DRAPE 54X76 (DRAPES) ×3 IMPLANT
DRAPE INCISE IOBAN 66X45 STRL (DRAPES) ×1 IMPLANT
DRAPE ORTHO SPLIT 77X108 STRL (DRAPES) ×3
DRAPE SURG ORHT 6 SPLT 77X108 (DRAPES) ×2 IMPLANT
DRSG ADAPTIC 3X8 NADH LF (GAUZE/BANDAGES/DRESSINGS) ×1 IMPLANT
DRSG PAD ABDOMINAL 8X10 ST (GAUZE/BANDAGES/DRESSINGS) ×2 IMPLANT
DRSG TELFA 3X8 NADH (GAUZE/BANDAGES/DRESSINGS) ×3 IMPLANT
ELECT CAUTERY BLADE 6.4 (BLADE) ×5 IMPLANT
ELECT REM PT RETURN 9FT ADLT (ELECTROSURGICAL) ×3
ELECTRODE REM PT RTRN 9FT ADLT (ELECTROSURGICAL) ×2 IMPLANT
FACESHIELD WRAPAROUND (MASK) IMPLANT
FACESHIELD WRAPAROUND OR TEAM (MASK) IMPLANT
FILTER STRAW FLUID ASPIR (MISCELLANEOUS) ×1 IMPLANT
GAUZE SPONGE 4X4 12PLY STRL (GAUZE/BANDAGES/DRESSINGS) ×2 IMPLANT
GAUZE XEROFORM 5X9 LF (GAUZE/BANDAGES/DRESSINGS) ×3 IMPLANT
GLOVE ORTHO TXT STRL SZ7.5 (GLOVE) ×2 IMPLANT
GLOVE SKINSENSE NS SZ7.5 (GLOVE) ×1
GLOVE SKINSENSE STRL SZ7.5 (GLOVE) ×8 IMPLANT
GOWN STRL REIN XL XLG (GOWN DISPOSABLE) ×5 IMPLANT
GRAFT DERMACARRIERS 1 TO 1.5 (DISPOSABLE) IMPLANT
HANDPIECE INTERPULSE COAX TIP (DISPOSABLE) ×3
KIT BASIN OR (CUSTOM PROCEDURE TRAY) ×3 IMPLANT
KIT ROOM TURNOVER OR (KITS) ×3 IMPLANT
MANIFOLD NEPTUNE II (INSTRUMENTS) ×3 IMPLANT
NS IRRIG 1000ML POUR BTL (IV SOLUTION) ×3 IMPLANT
PACK ORTHO EXTREMITY (CUSTOM PROCEDURE TRAY) ×3 IMPLANT
PACK UNIVERSAL I (CUSTOM PROCEDURE TRAY) ×3 IMPLANT
PAD ARMBOARD 7.5X6 YLW CONV (MISCELLANEOUS) ×5 IMPLANT
PAD DRESSING TELFA 3X8 NADH (GAUZE/BANDAGES/DRESSINGS) IMPLANT
PADDING CAST COTTON 6X4 STRL (CAST SUPPLIES) ×2 IMPLANT
SET HNDPC FAN SPRY TIP SCT (DISPOSABLE) ×2 IMPLANT
SPONGE LAP 18X18 X RAY DECT (DISPOSABLE) ×7 IMPLANT
STAPLER VISISTAT 35W (STAPLE) ×1 IMPLANT
STOCKINETTE IMPERVIOUS 9X36 MD (GAUZE/BANDAGES/DRESSINGS) ×3 IMPLANT
SUT ETHILON 2 0 FS 18 (SUTURE) ×2 IMPLANT
SUT MNCRL AB 4-0 PS2 18 (SUTURE) ×2 IMPLANT
SUT MON AB 2-0 CT1 36 (SUTURE) ×2 IMPLANT
SYR TB 1ML LUER SLIP (SYRINGE) ×1 IMPLANT
TOWEL OR 17X24 6PK STRL BLUE (TOWEL DISPOSABLE) ×3 IMPLANT
TOWEL OR 17X26 10 PK STRL BLUE (TOWEL DISPOSABLE) ×3 IMPLANT
TUBE ANAEROBIC SPECIMEN COL (MISCELLANEOUS) ×2 IMPLANT
TUBE CONNECTING 12X1/4 (SUCTIONS) ×3 IMPLANT
TUBING CYSTO DISP (UROLOGICAL SUPPLIES) ×3 IMPLANT
UNDERPAD 30X30 INCONTINENT (UNDERPADS AND DIAPERS) ×3 IMPLANT
YANKAUER SUCT BULB TIP NO VENT (SUCTIONS) ×3 IMPLANT

## 2015-06-07 NOTE — Anesthesia Procedure Notes (Signed)
Procedure Name: LMA Insertion Date/Time: 06/07/2015 5:43 PM Performed by: Sarita Haver T Pre-anesthesia Checklist: Patient identified, Timeout performed, Emergency Drugs available, Suction available and Patient being monitored Patient Re-evaluated:Patient Re-evaluated prior to inductionOxygen Delivery Method: Circle system utilized and Simple face mask Preoxygenation: Pre-oxygenation with 100% oxygen Intubation Type: IV induction Ventilation: Mask ventilation without difficulty LMA: LMA inserted LMA Size: 4.5 Number of attempts: 1 Airway Equipment and Method: Patient positioned with wedge pillow Placement Confirmation: positive ETCO2 and breath sounds checked- equal and bilateral Tube secured with: Tape Dental Injury: Teeth and Oropharynx as per pre-operative assessment

## 2015-06-07 NOTE — Transfer of Care (Signed)
Immediate Anesthesia Transfer of Care Note  Patient: Stephen Juarez  Procedure(s) Performed: Procedure(s): IRRIGATION AND DEBRIDEMENT KNEE WITH SPLIT THICKNESS SKIN GRAFT (Left) SKIN GRAFT SPLIT THICKNESS (Left)  Patient Location: PACU  Anesthesia Type:General  Level of Consciousness: awake and alert   Airway & Oxygen Therapy: Patient Spontanous Breathing and Patient connected to nasal cannula oxygen  Post-op Assessment: Report given to RN, Post -op Vital signs reviewed and stable and Patient moving all extremities X 4  Post vital signs: Reviewed and stable  Last Vitals:  Filed Vitals:   06/07/15 1232  BP: 104/77  Pulse: 78  Temp: 36.1 C  Resp: 18    Complications: No apparent anesthesia complications

## 2015-06-07 NOTE — Op Note (Signed)
   Date of Surgery: 06/07/2015  INDICATIONS: Mr. Linn is a 60 y.o.-year-old male with a left knee wound that comes back today for debridement and skin grafting;  The patient did consent to the procedure after discussion of the risks and benefits.  PREOPERATIVE DIAGNOSIS: Left knee wound 8 x 4 cm  POSTOPERATIVE DIAGNOSIS: Same.  PROCEDURE: 1. Surgical preparation of left knee wound for skin grafting 2. Application of split thickness skin graft to left knee 8 x 4 cm 3. Application of wound vac less than 50 sq cm  SURGEON: N. Glee Arvin, M.D.  ASSIST: Hart Carwin, RNFA.  ANESTHESIA:  general  IV FLUIDS AND URINE: See anesthesia.  ESTIMATED BLOOD LOSS: minimal mL.  IMPLANTS: none  DRAINS: wound vac  COMPLICATIONS: None.  DESCRIPTION OF PROCEDURE: The patient was brought to the operating room and placed supine on the operating table.  The patient had been signed prior to the procedure and this was documented. The patient had the anesthesia placed by the anesthesiologist.  A time-out was performed to confirm that this was the correct patient, site, side and location. The patient did receive antibiotics prior to the incision and was re-dosed during the procedure as needed at indicated intervals.  A tourniquet not placed.  The patient had the operative extremity prepped and draped in the standard surgical fashion.    Of the wound bed was performed with a rongeur and knife by debriding a small amount of fibrinous material from the wound bed. The vast majority of the wound bed was viable granulation tissue. I then thoroughly irrigated the wound bed with 3 L of normal saline. I then took a split thickness skin graft that was 14/1000 of an inch thick with a dermatome from his anterior left thigh. This was meshed 1.5:1.  Epi-soaked Telfa was placed on the donor site. The skin graft was tacked down with staples.  Adaptic was placed on the skin graft and a wound VAC was placed on top of the  Adaptic and set to -75 mmHg. Sterile dressings were applied to the rest of the wounds and incisions. Patient tolerated the procedure well and had no immediate complications.  POSTOPERATIVE PLAN: The patient will keep that wound VAC until next week when we will take it down in the office to look at the skin graft.  Mayra Reel, MD [facility] [phone] 6:19 PM

## 2015-06-07 NOTE — Discharge Instructions (Signed)
° °  1. Do not remove the wound VAC.  The [facility] will be removed by the MD when he comes back for his follow up appointment. 2. Patient does need daily dressing changes to the donor site on the left thigh.  Change with xeroform and dry dressing daily.

## 2015-06-07 NOTE — Anesthesia Preprocedure Evaluation (Addendum)
Anesthesia Evaluation  Patient identified by MRN, date of birth, ID band Patient awake    Reviewed: Allergy & Precautions, NPO status , Patient's Chart, lab work & pertinent test results  Airway Mallampati: II  TM Distance: >3 FB Neck ROM: Full    Dental  (+) Poor Dentition, Missing, Dental Advisory Given   Pulmonary Current Smoker,    breath sounds clear to auscultation       Cardiovascular negative cardio ROS   Rhythm:Regular Rate:Normal     Neuro/Psych Seizures -,     GI/Hepatic negative GI ROS, Neg liver ROS,   Endo/Other  negative endocrine ROS  Renal/GU negative Renal ROS     Musculoskeletal   Abdominal   Peds  Hematology  (+) anemia ,   Anesthesia Other Findings   Reproductive/Obstetrics                            Lab Results  Component Value Date   WBC 8.9 05/29/2015   HGB 10.6* 05/29/2015   HCT 31.1* 05/29/2015   MCV 86.9 05/29/2015   PLT 158 05/29/2015   Lab Results  Component Value Date   CREATININE 0.90 05/29/2015   BUN 14 05/29/2015   NA 135 05/29/2015   K 3.8 05/29/2015   CL 106 05/29/2015   CO2 22 05/29/2015    Anesthesia Physical Anesthesia Plan  ASA: II  Anesthesia Plan: General   Post-op Pain Management:    Induction: Intravenous  Airway Management Planned: LMA  Additional Equipment:   Intra-op Plan:   Post-operative Plan: Extubation in OR  Informed Consent: I have reviewed the patients History and Physical, chart, labs and discussed the procedure including the risks, benefits and alternatives for the proposed anesthesia with the patient or authorized representative who has indicated his/her understanding and acceptance.   Dental advisory given  Plan Discussed with: CRNA  Anesthesia Plan Comments:         Anesthesia Quick Evaluation

## 2015-06-07 NOTE — Anesthesia Postprocedure Evaluation (Signed)
Anesthesia Post Note  Patient: Stephen Juarez  Procedure(s) Performed: Procedure(s) (LRB): IRRIGATION AND DEBRIDEMENT KNEE WITH SPLIT THICKNESS SKIN GRAFT (Left) SKIN GRAFT SPLIT THICKNESS (Left)  Patient location during evaluation: PACU Anesthesia Type: General Level of consciousness: awake and alert Pain management: pain level controlled Vital Signs Assessment: post-procedure vital signs reviewed and stable Respiratory status: spontaneous breathing, nonlabored ventilation, respiratory function stable and patient connected to nasal cannula oxygen Cardiovascular status: blood pressure returned to baseline and stable Postop Assessment: no signs of nausea or vomiting Anesthetic complications: no    Last Vitals:  Filed Vitals:   06/07/15 1924 06/07/15 1926  BP:    Pulse:    Temp: 36.3 C   Resp:  16    Last Pain:  Filed Vitals:   06/07/15 1926  PainSc: 3                  Reino Kent

## 2015-06-07 NOTE — H&P (Signed)
PREOPERATIVE H&P  Chief Complaint: left knee wound  HPI: Stephen Juarez is a 60 y.o. male who presents for surgical treatment of left knee wound.  He denies any changes in medical history.  Past Medical History  Diagnosis Date  . Seizures (HCC)   . Lumbar vertebral fracture (HCC) 2/    L2   Past Surgical History  Procedure Laterality Date  . Orif ankle fracture Left 05/2015  . Orif wrist fracture Left 05/2015  . Orif ankle fracture Left 05/30/2015    Procedure: OPEN REDUCTION INTERNAL FIXATION (ORIF) LEFT ANKLE FRACTURE;  Surgeon: Tarry Kos, MD;  Location: [facility] OR;  Service: Orthopedics;  Laterality: Left;  . Open reduction internal fixation (orif) distal radial fracture Left 05/30/2015    Procedure: OPEN REDUCTION INTERNAL FIXATION (ORIF) LEFT DISTAL RADIUS FRACTURE;  Surgeon: Tarry Kos, MD;  Location: [facility];  Service: Orthopedics;  Laterality: Left;  . Irrigation and debridement knee Left 05/30/2015    Procedure: IRRIGATION AND DEBRIDEMENT LEFT KNEE;  Surgeon: Tarry Kos, MD;  Location: [facility];  Service: Orthopedics;  Laterality: Left;  . I&d extremity Left 06/01/2015    Procedure: IRRIGATION AND DEBRIDEMENT Left Knee;  Surgeon: Tarry Kos, MD;  Location: [facility];  Service: Orthopedics;  Laterality: Left;   Social History   Social History  . Marital Status: Single    Spouse Name: N/A  . Number of Children: N/A  . Years of Education: N/A   Social History Main Topics  . Smoking status: Current Every Day Smoker -- 1.00 packs/day for 32 years    Types: Cigarettes  . Smokeless tobacco: Never Used  . Alcohol Use: Yes     Comment: OCCASIONAL  . Drug Use: No     Comment: " IN THE PAST "  . Sexual Activity: Not Asked   Other Topics Concern  . None   Social History Narrative   Family History  Problem Relation Age of Onset  . Alcohol abuse Father   . Alcohol abuse Brother   . Heart attack Paternal Grandfather    No Known Allergies Prior to Admission medications    Medication Sig Start Date End Date Taking? Authorizing Provider  oxyCODONE-acetaminophen (PERCOCET) 10-325 MG tablet Take 1-2 tablets by mouth every 4 (four) hours as needed for pain. 06/04/15  Yes Freeman Caldron, PA-C  polyethylene glycol (MIRALAX / GLYCOLAX) packet Take 17 g by mouth daily. 06/04/15  Yes Freeman Caldron, PA-C  docusate sodium (COLACE) 100 MG capsule Take 2 capsules (200 mg total) by mouth 2 (two) times daily. 06/04/15   Freeman Caldron, PA-C  methocarbamol (ROBAXIN) 500 MG tablet Take 2 tablets (1,000 mg total) by mouth every 6 (six) hours as needed for muscle spasms. 06/04/15   Freeman Caldron, PA-C  oxyCODONE (OXYCONTIN) 10 mg 12 hr tablet Take 1 tablet (10 mg total) by mouth every 12 (twelve) hours. 06/04/15   Freeman Caldron, PA-C     Positive ROS: All other systems have been reviewed and were otherwise negative with the exception of those mentioned in the HPI and as above.  Physical Exam: General: Alert, no acute distress Cardiovascular: No pedal edema Respiratory: No cyanosis, no use of accessory musculature GI: abdomen soft Skin: No lesions in the area of chief complaint Neurologic: Sensation intact distally Psychiatric: Patient is competent for consent with normal mood and affect Lymphatic: no lymphedema  MUSCULOSKELETAL: exam stable  Assessment: left knee wound  Plan: Plan for Procedure(s):  IRRIGATION AND DEBRIDEMENT LEFT KNEE WITH SPLIT THICKNESS SKIN GRAFT SKIN GRAFT SPLIT THICKNESS  The risks benefits and alternatives were discussed with the patient including but not limited to the risks of nonoperative treatment, versus surgical intervention including infection, bleeding, nerve injury,  blood clots, cardiopulmonary complications, morbidity, mortality, among others, and they were willing to proceed.   Cheral Almas, MD   06/07/2015 7:56 AM

## 2015-06-08 ENCOUNTER — Encounter (HOSPITAL_COMMUNITY): Payer: Self-pay | Admitting: Orthopaedic Surgery

## 2015-06-08 NOTE — H&P (Signed)
PREOPERATIVE H&P  Chief Complaint: left knee wound  HPI: Stephen Juarez is a 60 y.o. male who presents for surgical treatment of left knee wound.  He denies any changes in medical history.  Past Medical History  Diagnosis Date  . Seizures (HCC)   . Lumbar vertebral fracture (HCC) 2/    L2   Past Surgical History  Procedure Laterality Date  . Orif ankle fracture Left 05/2015  . Orif wrist fracture Left 05/2015  . Orif ankle fracture Left 05/30/2015    Procedure: OPEN REDUCTION INTERNAL FIXATION (ORIF) LEFT ANKLE FRACTURE;  Surgeon: Tarry Kos, MD;  [facility]: [facility];  Service: Orthopedics;  Laterality: Left;  . Open reduction internal fixation (orif) distal radial fracture Left 05/30/2015    Procedure: OPEN REDUCTION INTERNAL FIXATION (ORIF) LEFT DISTAL RADIUS FRACTURE;  Surgeon: Tarry Kos, MD;  Location: [facility];  Service: Orthopedics;  Laterality: Left;  . Irrigation and debridement knee Left 05/30/2015    Procedure: IRRIGATION AND DEBRIDEMENT LEFT KNEE;  Surgeon: Tarry Kos, MD;  Location: [facility];  Service: Orthopedics;  Laterality: Left;  . I&d extremity Left 06/01/2015    Procedure: IRRIGATION AND DEBRIDEMENT Left Knee;  Surgeon: Tarry Kos, MD;  Location: [facility];  Service: Orthopedics;  Laterality: Left;  . Irrigation and debridement knee Left 06/07/2015    Procedure: IRRIGATION AND DEBRIDEMENT KNEE WITH SPLIT THICKNESS SKIN GRAFT;  Surgeon: Tarry Kos, MD;  [facility]: [facility];  Service: Orthopedics;  Laterality: Left;  . Skin split graft Left 06/07/2015    Procedure: SKIN GRAFT SPLIT THICKNESS;  Surgeon: Tarry Kos, MD;  Location: [facility];  Service: Orthopedics;  Laterality: Left;   Social History   Social History  . Marital Status: Single    Spouse Name: N/A  . Number of Children: N/A  . Years of Education: N/A   Social History Main Topics  . Smoking status: Current Every Day Smoker -- 1.00 packs/day for 32 years    Types: Cigarettes  . Smokeless tobacco: Never Used    . Alcohol Use: Yes     Comment: OCCASIONAL  . Drug Use: No     Comment: " IN THE PAST "  . Sexual Activity: Not Asked   Other Topics Concern  . None   Social History Narrative   Family History  Problem Relation Age of Onset  . Alcohol abuse Father   . Alcohol abuse Brother   . Heart attack Paternal Grandfather    No Known Allergies Prior to Admission medications   Medication Sig Start Date End Date Taking? Authorizing Provider  docusate sodium (COLACE) 100 MG capsule Take 2 capsules (200 mg total) by mouth 2 (two) times daily. 06/04/15  Yes Stephen Caldron, PA-C  methocarbamol (ROBAXIN) 500 MG tablet Take 2 tablets (1,000 mg total) by mouth every 6 (six) hours as needed for muscle spasms. 06/04/15  Yes Stephen Caldron, PA-C  oxyCODONE (OXYCONTIN) 10 mg 12 hr tablet Take 1 tablet (10 mg total) by mouth every 12 (twelve) hours. 06/04/15  Yes Stephen Caldron, PA-C  oxyCODONE-acetaminophen (PERCOCET) 10-325 MG tablet Take 1-2 tablets by mouth every 4 (four) hours as needed for pain. 06/04/15  Yes Stephen Caldron, PA-C  polyethylene glycol (MIRALAX / GLYCOLAX) packet Take 17 g by mouth daily. 06/04/15  Yes Stephen Caldron, PA-C     Positive ROS: All other systems have been reviewed and were otherwise negative with the exception of those mentioned in  the HPI and as above.  Physical Exam: General: Alert, no acute distress Cardiovascular: No pedal edema Respiratory: No cyanosis, no use of accessory musculature GI: abdomen soft Skin: No lesions in the area of chief complaint Neurologic: Sensation intact distally Psychiatric: Patient is competent for consent with normal mood and affect Lymphatic: no lymphedema  MUSCULOSKELETAL: exam stable  Assessment: left knee wound  Plan: Plan for Procedure(s): IRRIGATION AND DEBRIDEMENT KNEE WITH SPLIT THICKNESS SKIN GRAFT SKIN GRAFT SPLIT THICKNESS  The risks benefits and alternatives were discussed with the patient including but  not limited to the risks of nonoperative treatment, versus surgical intervention including infection, bleeding, nerve injury,  blood clots, cardiopulmonary complications, morbidity, mortality, among others, and they were willing to proceed.   Stephen Juarez, Stephen Furrow Michael, MD   06/08/2015 11:42 AM

## 2015-06-09 ENCOUNTER — Encounter: Payer: Self-pay | Admitting: Internal Medicine

## 2015-06-09 ENCOUNTER — Other Ambulatory Visit: Payer: Self-pay | Admitting: Internal Medicine

## 2015-06-09 DIAGNOSIS — M62830 Muscle spasm of back: Secondary | ICD-10-CM | POA: Insufficient documentation

## 2015-06-09 DIAGNOSIS — R569 Unspecified convulsions: Secondary | ICD-10-CM | POA: Insufficient documentation

## 2015-06-09 DIAGNOSIS — Z789 Other specified health status: Secondary | ICD-10-CM | POA: Insufficient documentation

## 2015-06-09 DIAGNOSIS — K5900 Constipation, unspecified: Secondary | ICD-10-CM | POA: Insufficient documentation

## 2015-06-09 DIAGNOSIS — Z7289 Other problems related to lifestyle: Secondary | ICD-10-CM | POA: Insufficient documentation

## 2015-06-09 NOTE — Assessment & Plan Note (Signed)
SNF - probable ETOH withdrawal; will monitor

## 2015-06-09 NOTE — Assessment & Plan Note (Signed)
SNF - casted per ortho

## 2015-06-09 NOTE — Assessment & Plan Note (Deleted)
SNF - cont robaxin 500 mg q 6

## 2015-06-09 NOTE — Assessment & Plan Note (Signed)
SNF - will support abstinence
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
## 2015-06-09 NOTE — Assessment & Plan Note (Signed)
SNF - seen by N'surg; conservative tx with pain relief

## 2015-06-09 NOTE — Progress Notes (Signed)
This encounter was created in error - please disregard.  This encounter was created in error - please disregard.
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
## 2015-06-09 NOTE — Assessment & Plan Note (Addendum)
2/22 -- Open treatment internal fixation of left distal radius fracture greater than 3 fragments, open treatment internal fixation of left lateral malleolus fracture and syndesmosissurgery; SNF - OT/PT

## 2015-06-09 NOTE — Assessment & Plan Note (Signed)
irrigation and debridement of left knee wound muscle, skin, fascia with undermining approximately 10 x 10 cm, and application of wound VAC < 50 sq cm by Dr. Roda ShuttersXu SNF - wound care and care of wound vac

## 2015-06-09 NOTE — Assessment & Plan Note (Signed)
SNF - d/c Hb was 10.6, will follow up with CBC

## 2015-06-09 NOTE — Assessment & Plan Note (Signed)
SNF - cont colace and mirilax

## 2015-08-07 ENCOUNTER — Encounter: Payer: Self-pay | Admitting: Internal Medicine

## 2015-08-07 ENCOUNTER — Non-Acute Institutional Stay (SKILLED_NURSING_FACILITY): Payer: Self-pay | Admitting: Internal Medicine

## 2015-08-07 DIAGNOSIS — Z720 Tobacco use: Secondary | ICD-10-CM | POA: Insufficient documentation

## 2015-08-07 DIAGNOSIS — S81002D Unspecified open wound, left knee, subsequent encounter: Secondary | ICD-10-CM

## 2015-08-07 DIAGNOSIS — S82892A Other fracture of left lower leg, initial encounter for closed fracture: Secondary | ICD-10-CM

## 2015-08-07 NOTE — Assessment & Plan Note (Signed)
As of 4/18 knee wound was considered healed and wound care nurse has signed off

## 2015-08-07 NOTE — Assessment & Plan Note (Signed)
As of 4/27 pt was released from walking boot and is WBAT; PT relays he has been working but has yet to bear weight
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
## 2015-08-07 NOTE — Assessment & Plan Note (Signed)
Pt has been started on nicotine patch; will monitor

## 2015-08-10 ENCOUNTER — Encounter: Payer: Self-pay | Admitting: Internal Medicine

## 2015-08-10 ENCOUNTER — Non-Acute Institutional Stay (SKILLED_NURSING_FACILITY): Payer: Self-pay | Admitting: Internal Medicine

## 2015-08-10 DIAGNOSIS — S62102D Fracture of unspecified carpal bone, left wrist, subsequent encounter for fracture with routine healing: Secondary | ICD-10-CM

## 2015-08-10 DIAGNOSIS — D62 Acute posthemorrhagic anemia: Secondary | ICD-10-CM

## 2015-08-10 DIAGNOSIS — S32028D Other fracture of second lumbar vertebra, subsequent encounter for fracture with routine healing: Secondary | ICD-10-CM

## 2015-08-10 DIAGNOSIS — Z7289 Other problems related to lifestyle: Secondary | ICD-10-CM

## 2015-08-10 DIAGNOSIS — S81002D Unspecified open wound, left knee, subsequent encounter: Secondary | ICD-10-CM

## 2015-08-10 DIAGNOSIS — Z789 Other specified health status: Secondary | ICD-10-CM

## 2015-08-10 DIAGNOSIS — R569 Unspecified convulsions: Secondary | ICD-10-CM

## 2015-08-10 DIAGNOSIS — S82892A Other fracture of left lower leg, initial encounter for closed fracture: Secondary | ICD-10-CM

## 2015-08-10 DIAGNOSIS — Z720 Tobacco use: Secondary | ICD-10-CM
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
## 2015-08-11 ENCOUNTER — Encounter: Payer: Self-pay | Admitting: Internal Medicine

## 2015-09-24 ENCOUNTER — Other Ambulatory Visit: Payer: Self-pay | Admitting: Orthopaedic Surgery

## 2015-09-24 ENCOUNTER — Encounter (HOSPITAL_BASED_OUTPATIENT_CLINIC_OR_DEPARTMENT_OTHER): Payer: Self-pay | Admitting: *Deleted

## 2015-09-26 ENCOUNTER — Ambulatory Visit (HOSPITAL_BASED_OUTPATIENT_CLINIC_OR_DEPARTMENT_OTHER)
Admission: RE | Admit: 2015-09-26 | Discharge: 2015-09-26 | Disposition: A | Discharge status: Home / Self Care | Payer: Self-pay | Source: Ambulatory Visit | Attending: Orthopaedic Surgery | Admitting: Orthopaedic Surgery

## 2015-09-26 ENCOUNTER — Ambulatory Visit (HOSPITAL_BASED_OUTPATIENT_CLINIC_OR_DEPARTMENT_OTHER): Payer: Self-pay | Admitting: Anesthesiology

## 2015-09-26 ENCOUNTER — Encounter (HOSPITAL_BASED_OUTPATIENT_CLINIC_OR_DEPARTMENT_OTHER): Payer: Self-pay

## 2015-09-26 ENCOUNTER — Encounter (HOSPITAL_BASED_OUTPATIENT_CLINIC_OR_DEPARTMENT_OTHER)
Admission: RE | Disposition: A | Discharge status: Home / Self Care | Payer: Self-pay | Source: Ambulatory Visit | Attending: Orthopaedic Surgery

## 2015-09-26 DIAGNOSIS — F17210 Nicotine dependence, cigarettes, uncomplicated: Secondary | ICD-10-CM | POA: Insufficient documentation

## 2015-09-26 DIAGNOSIS — Z4589 Encounter for adjustment and management of other implanted devices: Secondary | ICD-10-CM | POA: Insufficient documentation

## 2015-09-26 HISTORY — PX: HARDWARE REMOVAL: SHX979

## 2015-09-26 SURGERY — REMOVAL, HARDWARE
Anesthesia: General | Site: Ankle | Laterality: Left

## 2015-09-26 MED ORDER — OXYCODONE HCL 5 MG PO TABS
ORAL_TABLET | ORAL | Status: AC
  Filled 2015-09-26: qty 1

## 2015-09-26 MED ORDER — GLYCOPYRROLATE 0.2 MG/ML IJ SOLN: 0.2000 mg | Freq: Once | INTRAMUSCULAR | Status: DC | PRN

## 2015-09-26 MED ORDER — HYDROMORPHONE HCL 1 MG/ML IJ SOLN
0.2500 mg | INTRAMUSCULAR | Status: DC | PRN
  Administered 2015-09-26 (×4): 0.5 mg via INTRAVENOUS

## 2015-09-26 MED ORDER — MIDAZOLAM HCL 2 MG/2ML IJ SOLN: 1.0000 mg | INTRAMUSCULAR | Status: DC | PRN

## 2015-09-26 MED ORDER — CEFAZOLIN SODIUM-DEXTROSE 2-4 GM/100ML-% IV SOLN
2.0000 g | INTRAVENOUS | Status: AC
  Administered 2015-09-26: 2 g via INTRAVENOUS

## 2015-09-26 MED ORDER — CEFAZOLIN SODIUM-DEXTROSE 2-4 GM/100ML-% IV SOLN
INTRAVENOUS | Status: AC
  Filled 2015-09-26: qty 100

## 2015-09-26 MED ORDER — PROPOFOL 10 MG/ML IV BOLUS
INTRAVENOUS | Status: DC | PRN
  Administered 2015-09-26: 300 mg via INTRAVENOUS

## 2015-09-26 MED ORDER — DEXAMETHASONE SODIUM PHOSPHATE 10 MG/ML IJ SOLN
INTRAMUSCULAR | Status: DC | PRN
  Administered 2015-09-26: 10 mg via INTRAVENOUS

## 2015-09-26 MED ORDER — FENTANYL CITRATE (PF) 100 MCG/2ML IJ SOLN
INTRAMUSCULAR | Status: DC | PRN
  Administered 2015-09-26: 100 ug via INTRAVENOUS

## 2015-09-26 MED ORDER — LIDOCAINE HCL (CARDIAC) 20 MG/ML IV SOLN
INTRAVENOUS | Status: DC | PRN
  Administered 2015-09-26: 30 mg via INTRAVENOUS

## 2015-09-26 MED ORDER — MIDAZOLAM HCL 5 MG/5ML IJ SOLN
INTRAMUSCULAR | Status: DC | PRN
  Administered 2015-09-26: 2 mg via INTRAVENOUS

## 2015-09-26 MED ORDER — OXYCODONE HCL 5 MG/5ML PO SOLN: 5.0000 mg | Freq: Once | ORAL | Status: AC | PRN

## 2015-09-26 MED ORDER — 0.9 % SODIUM CHLORIDE (POUR BTL) OPTIME
TOPICAL | Status: DC | PRN
  Administered 2015-09-26: 1000 mL

## 2015-09-26 MED ORDER — OXYCODONE HCL 5 MG PO TABS
5.0000 mg | ORAL_TABLET | Freq: Once | ORAL | Status: AC | PRN
  Administered 2015-09-26: 5 mg via ORAL

## 2015-09-26 MED ORDER — LACTATED RINGERS IV SOLN
INTRAVENOUS | Status: DC
  Administered 2015-09-26: 08:00:00 via INTRAVENOUS

## 2015-09-26 MED ORDER — HYDROMORPHONE HCL 1 MG/ML IJ SOLN
INTRAMUSCULAR | Status: AC
  Filled 2015-09-26: qty 1

## 2015-09-26 MED ORDER — MIDAZOLAM HCL 2 MG/2ML IJ SOLN
INTRAMUSCULAR | Status: AC
  Filled 2015-09-26: qty 2

## 2015-09-26 MED ORDER — PROPOFOL 10 MG/ML IV BOLUS
INTRAVENOUS | Status: AC
  Filled 2015-09-26: qty 40

## 2015-09-26 MED ORDER — SCOPOLAMINE 1 MG/3DAYS TD PT72: 1.0000 | MEDICATED_PATCH | Freq: Once | TRANSDERMAL | Status: DC | PRN

## 2015-09-26 MED ORDER — FENTANYL CITRATE (PF) 100 MCG/2ML IJ SOLN
INTRAMUSCULAR | Status: AC
  Filled 2015-09-26: qty 2

## 2015-09-26 MED ORDER — HYDROCODONE-ACETAMINOPHEN 5-325 MG PO TABS: 1.0000 | ORAL_TABLET | Freq: Four times a day (QID) | ORAL | Status: AC | PRN

## 2015-09-26 MED ORDER — FENTANYL CITRATE (PF) 100 MCG/2ML IJ SOLN: 50.0000 ug | INTRAMUSCULAR | Status: DC | PRN

## 2015-09-26 MED ORDER — DEXAMETHASONE SODIUM PHOSPHATE 10 MG/ML IJ SOLN
INTRAMUSCULAR | Status: AC
  Filled 2015-09-26: qty 1

## 2015-09-26 MED ORDER — MEPERIDINE HCL 25 MG/ML IJ SOLN: 6.2500 mg | INTRAMUSCULAR | Status: DC | PRN

## 2015-09-26 MED ORDER — ONDANSETRON HCL 4 MG/2ML IJ SOLN
INTRAMUSCULAR | Status: AC
  Filled 2015-09-26: qty 2

## 2015-09-26 MED ORDER — LIDOCAINE 2% (20 MG/ML) 5 ML SYRINGE
INTRAMUSCULAR | Status: AC
  Filled 2015-09-26: qty 5

## 2015-09-26 MED ORDER — ONDANSETRON HCL 4 MG/2ML IJ SOLN
INTRAMUSCULAR | Status: DC | PRN
  Administered 2015-09-26: 4 mg via INTRAVENOUS

## 2015-09-26 MED ORDER — PHENYLEPHRINE HCL 10 MG/ML IJ SOLN
INTRAMUSCULAR | Status: DC | PRN
  Administered 2015-09-26: 80 ug via INTRAVENOUS

## 2015-09-26 SURGICAL SUPPLY — 75 items
APL SKNCLS STERI-STRIP NONHPOA (GAUZE/BANDAGES/DRESSINGS)
BANDAGE ACE 4X5 VEL STRL LF (GAUZE/BANDAGES/DRESSINGS) IMPLANT
BANDAGE ACE 6X5 VEL STRL LF (GAUZE/BANDAGES/DRESSINGS) ×3 IMPLANT
BANDAGE ESMARK 6X9 LF (GAUZE/BANDAGES/DRESSINGS) ×1 IMPLANT
BENZOIN TINCTURE PRP APPL 2/3 (GAUZE/BANDAGES/DRESSINGS) IMPLANT
BLADE HEX COATED 2.75 (ELECTRODE) IMPLANT
BLADE SURG 15 STRL LF DISP TIS (BLADE) ×2 IMPLANT
BLADE SURG 15 STRL SS (BLADE) ×6
BNDG CMPR 9X6 STRL LF SNTH (GAUZE/BANDAGES/DRESSINGS) ×1
BNDG COHESIVE 3X5 TAN STRL LF (GAUZE/BANDAGES/DRESSINGS) ×3 IMPLANT
BNDG ESMARK 6X9 LF (GAUZE/BANDAGES/DRESSINGS) ×3
CANISTER SUCT 1200ML W/VALVE (MISCELLANEOUS) ×1 IMPLANT
CLOSURE WOUND 1/2 X4 (GAUZE/BANDAGES/DRESSINGS)
COVER BACK TABLE 60X90IN (DRAPES) ×3 IMPLANT
CUFF TOURNIQUET SINGLE 24IN (TOURNIQUET CUFF) IMPLANT
CUFF TOURNIQUET SINGLE 34IN LL (TOURNIQUET CUFF) IMPLANT
DECANTER SPIKE VIAL GLASS SM (MISCELLANEOUS) IMPLANT
DRAPE EXTREMITY T 121X128X90 (DRAPE) ×3 IMPLANT
DRAPE IMP U-DRAPE 54X76 (DRAPES) ×3 IMPLANT
DRAPE OEC MINIVIEW 54X84 (DRAPES) ×3 IMPLANT
DRAPE U-SHAPE 47X51 STRL (DRAPES) ×2 IMPLANT
DURAPREP 26ML APPLICATOR (WOUND CARE) ×3 IMPLANT
ELECT REM PT RETURN 9FT ADLT (ELECTROSURGICAL) ×3
ELECTRODE REM PT RTRN 9FT ADLT (ELECTROSURGICAL) ×1 IMPLANT
GAUZE SPONGE 4X4 12PLY STRL (GAUZE/BANDAGES/DRESSINGS) ×3 IMPLANT
GAUZE SPONGE 4X4 16PLY XRAY LF (GAUZE/BANDAGES/DRESSINGS) IMPLANT
GAUZE XEROFORM 1X8 LF (GAUZE/BANDAGES/DRESSINGS) ×3 IMPLANT
GLOVE SKINSENSE NS SZ7.5 (GLOVE) ×2
GLOVE SKINSENSE STRL SZ7.5 (GLOVE) ×1 IMPLANT
GLOVE SURG SYN 7.5  E (GLOVE) ×2
GLOVE SURG SYN 7.5 E (GLOVE) ×1 IMPLANT
GLOVE SURG SYN 7.5 PF PI (GLOVE) ×1 IMPLANT
GOWN STRL REIN XL XLG (GOWN DISPOSABLE) ×3 IMPLANT
GOWN STRL REUS W/ TWL LRG LVL3 (GOWN DISPOSABLE) ×1 IMPLANT
GOWN STRL REUS W/TWL LRG LVL3 (GOWN DISPOSABLE) ×3
NEEDLE HYPO 22GX1.5 SAFETY (NEEDLE) IMPLANT
NS IRRIG 1000ML POUR BTL (IV SOLUTION) ×3 IMPLANT
PACK BASIN DAY SURGERY FS (CUSTOM PROCEDURE TRAY) ×3 IMPLANT
PAD CAST 3X4 CTTN HI CHSV (CAST SUPPLIES) IMPLANT
PAD CAST 4YDX4 CTTN HI CHSV (CAST SUPPLIES) IMPLANT
PADDING CAST COTTON 3X4 STRL (CAST SUPPLIES)
PADDING CAST COTTON 4X4 STRL (CAST SUPPLIES) ×3
PADDING CAST COTTON 6X4 STRL (CAST SUPPLIES) IMPLANT
PADDING CAST SYN 6 (CAST SUPPLIES)
PADDING CAST SYNTHETIC 4 (CAST SUPPLIES)
PADDING CAST SYNTHETIC 4X4 STR (CAST SUPPLIES) IMPLANT
PADDING CAST SYNTHETIC 6X4 NS (CAST SUPPLIES) IMPLANT
PENCIL BUTTON HOLSTER BLD 10FT (ELECTRODE) ×3 IMPLANT
SHEET MEDIUM DRAPE 40X70 STRL (DRAPES) ×2 IMPLANT
SLEEVE SCD COMPRESS KNEE MED (MISCELLANEOUS) ×2 IMPLANT
SPLINT FAST PLASTER 5X30 (CAST SUPPLIES)
SPLINT FIBERGLASS 3X35 (CAST SUPPLIES) IMPLANT
SPLINT FIBERGLASS 4X30 (CAST SUPPLIES) IMPLANT
SPLINT PLASTER CAST FAST 5X30 (CAST SUPPLIES) IMPLANT
SPONGE LAP 18X18 X RAY DECT (DISPOSABLE) IMPLANT
STAPLER VISISTAT (STAPLE) IMPLANT
STOCKINETTE 6  STRL (DRAPES) ×2
STOCKINETTE 6 STRL (DRAPES) IMPLANT
STRIP CLOSURE SKIN 1/2X4 (GAUZE/BANDAGES/DRESSINGS) IMPLANT
SUCTION FRAZIER HANDLE 10FR (MISCELLANEOUS) ×2
SUCTION TUBE FRAZIER 10FR DISP (MISCELLANEOUS) ×1 IMPLANT
SUT ETHILON 3 0 PS 1 (SUTURE) ×2 IMPLANT
SUT MNCRL AB 4-0 PS2 18 (SUTURE) IMPLANT
SUT VIC AB 2-0 CT1 27 (SUTURE)
SUT VIC AB 2-0 CT1 TAPERPNT 27 (SUTURE) IMPLANT
SUT VIC AB 3-0 SH 27 (SUTURE) ×3
SUT VIC AB 3-0 SH 27X BRD (SUTURE) IMPLANT
SYR BULB 3OZ (MISCELLANEOUS) ×3 IMPLANT
SYR CONTROL 10ML LL (SYRINGE) IMPLANT
TOWEL OR 17X24 6PK STRL BLUE (TOWEL DISPOSABLE) ×3 IMPLANT
TOWEL OR NON WOVEN STRL DISP B (DISPOSABLE) ×3 IMPLANT
TUBE CONNECTING 20'X1/4 (TUBING)
TUBE CONNECTING 20X1/4 (TUBING) ×1 IMPLANT
UNDERPAD 30X30 (UNDERPADS AND DIAPERS) ×3 IMPLANT
YANKAUER SUCT BULB TIP NO VENT (SUCTIONS) IMPLANT

## 2015-09-26 NOTE — Transfer of Care (Signed)
Immediate Anesthesia Transfer of Care Note  Patient: Stephen Juarez  Procedure(s) Performed: Procedure(s): LEFT ANKLE HARDWARE REMOVAL (Left)  Patient Location: PACU  Anesthesia Type:General  Level of Consciousness: sedated  Airway & Oxygen Therapy: Patient Spontanous Breathing and Patient connected to face mask oxygen  Post-op Assessment: Report given to RN and Post -op Vital signs reviewed and stable  Post vital signs: Reviewed and stable  Last Vitals:  Filed Vitals:   09/26/15 0741  BP: 115/70  Pulse: 85  Temp: 36.5 C  Resp: 20    Last Pain:  Filed Vitals:   09/26/15 0742  PainSc: 2       Patients Stated Pain Goal: 2 (09/26/15 0737)  Complications: No apparent anesthesia complications 

## 2015-09-26 NOTE — Discharge Instructions (Signed)
Postoperative instructions: ° °Weightbearing: as tolerated ° °Keep your dressing and/or splint clean and dry at all times.  You can remove your dressing on post-operative day #3 and change with a dry/sterile dressing or Band-Aids as needed thereafter.   ° °Incision instructions:  Do not soak your incision for 3 weeks after surgery.  If the incision gets wet, pat dry and do not scrub the incision. ° °Pain control:  You have been given a prescription to be taken as directed for post-operative pain control.  In addition, elevate the operative extremity above the heart at all times to prevent swelling and throbbing pain. ° °Take over-the-counter Colace, 100mg by mouth twice a day while taking narcotic pain medications to help prevent constipation. ° °Follow up appointments: °1) 10-14 days for suture removal and wound check. °2) Dr. Xu as scheduled. ° ° ------------------------------------------------------------------------------------------------------------- ° °After Surgery Pain Control: ° °After your surgery, post-surgical discomfort or pain is likely. This discomfort can last several days to a few weeks. At certain times of the day your discomfort may be more intense.  °Did you receive a nerve block?  °A nerve block can provide pain relief for one hour to two days after your surgery. As long as the nerve block is working, you will experience little or no sensation in the area the surgeon operated on.  °As the nerve block wears off, you will begin to experience pain or discomfort. It is very important that you begin taking your prescribed pain medication before the nerve block fully wears off. Treating your pain at the first sign of the block wearing off will ensure your pain is better controlled and more tolerable when full-sensation returns. Do not wait until the pain is intolerable, as the medicine will be less effective. It is better to treat pain in advance than to try and catch up.  °General Anesthesia:  °If  you did not receive a nerve block during your surgery, you will need to start taking your pain medication shortly after your surgery and should continue to do so as prescribed by your surgeon.  °Pain Medication:  °Most commonly we prescribe Vicodin and Percocet for post-operative pain. Both of these medications contain a combination of acetaminophen (Tylenol®) and a narcotic to help control pain.  °· It takes between 30 and 45 minutes before pain medication starts to work. It is important to take your medication before your pain level gets too intense.  °· Nausea is a common side effect of many pain medications. You will want to eat something before taking your pain medicine to help prevent nausea.  °· If you are taking a prescription pain medication that contains acetaminophen, we recommend that you do not take additional over the counter acetaminophen (Tylenol®).  °Other pain relieving options:  °· Using a cold pack to ice the affected area a few times a day (15 to 20 minutes at a time) can help to relieve pain, reduce swelling and bruising.  °· Elevation of the affected area can also help to reduce pain and swelling. ° ° ° ° ° °Post Anesthesia Home Care Instructions ° °Activity: °Get plenty of rest for the remainder of the day. A responsible adult should stay with you for 24 hours following the procedure.  °For the next 24 hours, DO NOT: °-Drive a car °-Operate machinery °-Drink alcoholic beverages °-Take any medication unless instructed by your physician °-Make any legal decisions or sign important papers. ° °Meals: °Start with liquid foods such as gelatin or   soup. Progress to regular foods as tolerated. Avoid greasy, spicy, heavy foods. If nausea and/or vomiting occur, drink only clear liquids until the nausea and/or vomiting subsides. Call your physician if vomiting continues. ° °Special Instructions/Symptoms: °Your throat may feel dry or sore from the anesthesia or the breathing tube placed in your throat  during surgery. If this causes discomfort, gargle with warm salt water. The discomfort should disappear within 24 hours. ° °If you had a scopolamine patch placed behind your ear for the management of post- operative nausea and/or vomiting: ° °1. The medication in the patch is effective for 72 hours, after which it should be removed.  Wrap patch in a tissue and discard in the trash. Wash hands thoroughly with soap and water. °2. You may remove the patch earlier than 72 hours if you experience unpleasant side effects which may include dry mouth, dizziness or visual disturbances. °3. Avoid touching the patch. Wash your hands with soap and water after contact with the patch. °  ° ° °

## 2015-09-26 NOTE — H&P (Signed)
    PREOPERATIVE H&P  Chief Complaint: left ankle retained hardware  HPI: Stephen Juarez is a 60 y.o. male who presents for surgical treatment of left ankle retained hardware.  He denies any changes in medical history.  Past Medical History  Diagnosis Date  . Lumbar vertebral fracture (HCC) 2/    L2  . Seizures (HCC)     no seizures since 1993   Past Surgical History  Procedure Laterality Date  . Orif ankle fracture Left 05/2015  . Orif wrist fracture Left 05/2015  . Orif ankle fracture Left 05/30/2015    Procedure: OPEN REDUCTION INTERNAL FIXATION (ORIF) LEFT ANKLE FRACTURE;  Surgeon: Tarry KosNaiping M Maricia Scotti, MD;  Location: [facility] OR;  Service: Orthopedics;  Laterality: Left;  . Open reduction internal fixation (orif) distal radial fracture Left 05/30/2015    Procedure: OPEN REDUCTION INTERNAL FIXATION (ORIF) LEFT DISTAL RADIUS FRACTURE;  Surgeon: Tarry KosNaiping M Yittel Emrich, MD;  Location: [facility] OR;  Service: Orthopedics;  Laterality: Left;  . Irrigation and debridement knee Left 05/30/2015    Procedure: IRRIGATION AND DEBRIDEMENT LEFT KNEE;  Surgeon: Tarry KosNaiping M Deloria Brassfield, MD;  Location: [facility] OR;  Service: Orthopedics;  Laterality: Left;  . I&d extremity Left 06/01/2015    Procedure: IRRIGATION AND DEBRIDEMENT Left Knee;  Surgeon: Tarry KosNaiping M Jerney Baksh, MD;  Location: [facility] OR;  Service: Orthopedics;  Laterality: Left;  . Irrigation and debridement knee Left 06/07/2015    Procedure: IRRIGATION AND DEBRIDEMENT KNEE WITH SPLIT THICKNESS SKIN GRAFT;  Surgeon: Tarry KosNaiping M Sofija Antwi, MD;  Location: [facility] OR;  Service: Orthopedics;  Laterality: Left;  . Skin split graft Left 06/07/2015    Procedure: SKIN GRAFT SPLIT THICKNESS;  Surgeon: Tarry KosNaiping M Shawnell Dykes, MD;  Location: [facility] OR;  Service: Orthopedics;  Laterality: Left;   Social History   Social History  . Marital Status: Single    Spouse Name: N/A  . Number of Children: N/A  . Years of Education: N/A   Social History Main Topics  . Smoking status: Current Every Day Smoker -- 1.00 packs/day for 32 years   Types: Cigarettes  . Smokeless tobacco: Never Used  . Alcohol Use: No  . Drug Use: No     Comment: " IN THE PAST "  . Sexual Activity: Not Asked   Other Topics Concern  . None   Social History Narrative   Family History  Problem Relation Age of Onset  . Alcohol abuse Father   . Alcohol abuse Brother   . Heart attack Paternal Grandfather    No Known Allergies Prior to Admission medications   Not on File     Positive ROS: All other systems have been reviewed and were otherwise negative with the exception of those mentioned in the HPI and as above.  Physical Exam: General: Alert, no acute distress Cardiovascular: No pedal edema Respiratory: No cyanosis, no use of accessory musculature GI: abdomen soft Skin: No lesions in the area of chief complaint Neurologic: Sensation intact distally Psychiatric: Patient is competent for consent with normal mood and affect Lymphatic: no lymphedema  MUSCULOSKELETAL: exam stable  Assessment: left ankle retained hardware  Plan: Plan for Procedure(s): LEFT ANKLE HARDWARE REMOVAL  The risks benefits and alternatives were discussed with the patient including but not limited to the risks of nonoperative treatment, versus surgical intervention including infection, bleeding, nerve injury,  blood clots, cardiopulmonary complications, morbidity, mortality, among others, and they were willing to proceed.   [name], Stephen Buckalew Michael, MD   09/26/2015 8:05 AM

## 2015-09-26 NOTE — Anesthesia Postprocedure Evaluation (Signed)
Anesthesia Post Note  Patient: Stephen Juarez  Procedure(s) Performed: Procedure(s) (LRB): LEFT ANKLE HARDWARE REMOVAL (Left)  Patient location during evaluation: PACU Anesthesia Type: General Level of consciousness: awake and alert Pain management: pain level controlled Vital Signs Assessment: post-procedure vital signs reviewed and stable Respiratory status: spontaneous breathing, nonlabored ventilation and respiratory function stable Cardiovascular status: blood pressure returned to baseline and stable Postop Assessment: no signs of nausea or vomiting Anesthetic complications: no    Last Vitals:  Filed Vitals:   09/26/15 1030 09/26/15 1033  BP:  109/82  Pulse: 75 73  Temp:    Resp: 13 16    Last Pain:  Filed Vitals:   09/26/15 1035  PainSc: 4                  Chick Cousins A

## 2015-09-26 NOTE — Anesthesia Procedure Notes (Signed)
Procedure Name: LMA Insertion Date/Time: 09/26/2015 9:18 AM Performed by: Genevieve NorlanderLINKA, Stephen Juarez Pre-anesthesia Checklist: Patient identified, Emergency Drugs available, Suction available and Patient being monitored Patient Re-evaluated:Patient Re-evaluated prior to inductionOxygen Delivery Method: Circle system utilized Preoxygenation: Pre-oxygenation with 100% oxygen Intubation Type: IV induction Ventilation: Mask ventilation without difficulty LMA: LMA inserted LMA Size: 5.0 Number of attempts: 1 Airway Equipment and Method: Bite block Placement Confirmation: positive ETCO2 Tube secured with: Tape Dental Injury: Teeth and Oropharynx as per pre-operative assessment

## 2015-09-26 NOTE — Anesthesia Preprocedure Evaluation (Signed)
Anesthesia Evaluation  Patient identified by MRN, date of birth, ID band Patient awake    Reviewed: Allergy & Precautions, NPO status , Patient's Chart, lab work & pertinent test results  Airway Mallampati: I  TM Distance: >3 FB Neck ROM: Full    Dental  (+) Teeth Intact, Dental Advisory Given   Pulmonary Current Smoker,  breath sounds clear to auscultation        Cardiovascular Rhythm:Regular Rate:Normal     Neuro/Psych    GI/Hepatic   Endo/Other    Renal/GU      Musculoskeletal   Abdominal   Peds  Hematology   Anesthesia Other Findings   Reproductive/Obstetrics                             Anesthesia Physical Anesthesia Plan  ASA: I  Anesthesia Plan: General   Post-op Pain Management:    Induction: Intravenous  Airway Management Planned: LMA  Additional Equipment:   Intra-op Plan:   Post-operative Plan: Extubation in OR  Informed Consent: I have reviewed the patients History and Physical, chart, labs and discussed the procedure including the risks, benefits and alternatives for the proposed anesthesia with the patient or authorized representative who has indicated his/her understanding and acceptance.   Dental advisory given  Plan Discussed with: CRNA, Anesthesiologist and Surgeon  Anesthesia Plan Comments:         Anesthesia Quick Evaluation  

## 2015-09-26 NOTE — Transfer of Care (Signed)
Immediate Anesthesia Transfer of Care Note  Patient: Stephen Juarez  Procedure(s) Performed: Procedure(s): LEFT ANKLE HARDWARE REMOVAL (Left)  Patient Location: PACU  Anesthesia Type:General  Level of Consciousness: sedated  Airway & Oxygen Therapy: Patient Spontanous Breathing and Patient connected to face mask oxygen  Post-op Assessment: Report given to RN and Post -op Vital signs reviewed and stable  Post vital signs: Reviewed and stable  Last Vitals:  Filed Vitals:   09/26/15 0741  BP: 115/70  Pulse: 85  Temp: 36.5 C  Resp: 20    Last Pain:  Filed Vitals:   09/26/15 0742  PainSc: 2       Patients Stated Pain Goal: 2 (09/26/15 0737)  Complications: No apparent anesthesia complications

## 2015-09-26 NOTE — Op Note (Signed)
   Date of Surgery: 09/26/2015  INDICATIONS: Mr. Stephen Juarez is a 60 y.o.-year-old male with a left ankle retained syndesmotic screw;  The patient did consent to the procedure after discussion of the risks and benefits.  PREOPERATIVE DIAGNOSIS: Left ankle syndesmotic screw  POSTOPERATIVE DIAGNOSIS: Same.  PROCEDURE: Removal of left ankle syndesmotic screw, deep implant  SURGEON: N. Glee ArvinMichael Xu, M.D.  ASSIST: none.  ANESTHESIA:  general  IV FLUIDS AND URINE: See anesthesia.  ESTIMATED BLOOD LOSS: minimal mL.  IMPLANTS: none  DRAINS: none  COMPLICATIONS: None.  DESCRIPTION OF PROCEDURE: The patient was brought to the operating room and placed supine on the operating table.  The patient had been signed prior to the procedure and this was documented. The patient had the anesthesia placed by the anesthesiologist.  A time-out was performed to confirm that this was the correct patient, site, side and location. The patient did receive antibiotics prior to the incision and was re-dosed during the procedure as needed at indicated intervals.  A tourniquet placed.  The patient had the operative extremity prepped and draped in the standard surgical fashion.    Fluorosocpy was used to localize the location of the screw.  A 2 cm incision over the screw was created.  Sharp dissection was carried down to the plate.  The screw was exposed and backed out with a screw.  Fluoroscopy confirmed complete removal of the screw.  A stress test of the ankle was normal.  Wound was thoroughly irrigated and closed in a layered fashion using 3.0 vicryl and 3.0 nylon.  Sterile dressings were applied.  Patient tolerated the procedure well.  POSTOPERATIVE PLAN: Weight bearing as tolerated.  Mayra ReelN. Michael Xu, MD [facility] [phone] 9:41 AM

## 2015-09-27 ENCOUNTER — Encounter (HOSPITAL_BASED_OUTPATIENT_CLINIC_OR_DEPARTMENT_OTHER): Payer: Self-pay | Admitting: Orthopaedic Surgery
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
## 2019-07-19 ENCOUNTER — Encounter (HOSPITAL_COMMUNITY): Payer: Self-pay | Admitting: Radiology

## 2019-07-19 ENCOUNTER — Emergency Department (HOSPITAL_COMMUNITY): Payer: Medicaid Other

## 2019-07-19 ENCOUNTER — Inpatient Hospital Stay (HOSPITAL_COMMUNITY)
Admission: EM | Admit: 2019-07-19 | Discharge: 2019-09-06 | DRG: 004 | Disposition: E | Payer: Medicaid Other | Attending: General Surgery | Admitting: General Surgery

## 2019-07-19 ENCOUNTER — Inpatient Hospital Stay (HOSPITAL_COMMUNITY): Payer: Medicaid Other

## 2019-07-19 ENCOUNTER — Other Ambulatory Visit: Payer: Self-pay

## 2019-07-19 DIAGNOSIS — T1490XA Injury, unspecified, initial encounter: Secondary | ICD-10-CM

## 2019-07-19 DIAGNOSIS — J869 Pyothorax without fistula: Secondary | ICD-10-CM

## 2019-07-19 DIAGNOSIS — J90 Pleural effusion, not elsewhere classified: Secondary | ICD-10-CM

## 2019-07-19 DIAGNOSIS — Z4682 Encounter for fitting and adjustment of non-vascular catheter: Secondary | ICD-10-CM

## 2019-07-19 DIAGNOSIS — Z4659 Encounter for fitting and adjustment of other gastrointestinal appliance and device: Secondary | ICD-10-CM

## 2019-07-19 DIAGNOSIS — J9690 Respiratory failure, unspecified, unspecified whether with hypoxia or hypercapnia: Secondary | ICD-10-CM

## 2019-07-19 DIAGNOSIS — I609 Nontraumatic subarachnoid hemorrhage, unspecified: Secondary | ICD-10-CM

## 2019-07-19 DIAGNOSIS — R0902 Hypoxemia: Secondary | ICD-10-CM

## 2019-07-19 DIAGNOSIS — J9500 Unspecified tracheostomy complication: Secondary | ICD-10-CM

## 2019-07-19 DIAGNOSIS — S2241XA Multiple fractures of ribs, right side, initial encounter for closed fracture: Secondary | ICD-10-CM

## 2019-07-19 DIAGNOSIS — S069X9A Unspecified intracranial injury with loss of consciousness of unspecified duration, initial encounter: Secondary | ICD-10-CM

## 2019-07-19 DIAGNOSIS — L8990 Pressure ulcer of unspecified site, unspecified stage: Secondary | ICD-10-CM | POA: Insufficient documentation

## 2019-07-19 DIAGNOSIS — Z515 Encounter for palliative care: Secondary | ICD-10-CM

## 2019-07-19 DIAGNOSIS — Z930 Tracheostomy status: Secondary | ICD-10-CM

## 2019-07-19 DIAGNOSIS — J939 Pneumothorax, unspecified: Secondary | ICD-10-CM

## 2019-07-19 DIAGNOSIS — Z9689 Presence of other specified functional implants: Secondary | ICD-10-CM

## 2019-07-19 DIAGNOSIS — T07XXXA Unspecified multiple injuries, initial encounter: Secondary | ICD-10-CM

## 2019-07-19 DIAGNOSIS — S0990XA Unspecified injury of head, initial encounter: Principal | ICD-10-CM

## 2019-07-19 DIAGNOSIS — J9601 Acute respiratory failure with hypoxia: Secondary | ICD-10-CM | POA: Diagnosis present

## 2019-07-19 DIAGNOSIS — S0219XA Other fracture of base of skull, initial encounter for closed fracture: Secondary | ICD-10-CM | POA: Diagnosis present

## 2019-07-19 DIAGNOSIS — G40909 Epilepsy, unspecified, not intractable, without status epilepticus: Secondary | ICD-10-CM | POA: Diagnosis present

## 2019-07-19 DIAGNOSIS — Y9355 Activity, bike riding: Secondary | ICD-10-CM

## 2019-07-19 DIAGNOSIS — S32401A Unspecified fracture of right acetabulum, initial encounter for closed fracture: Secondary | ICD-10-CM | POA: Diagnosis present

## 2019-07-19 DIAGNOSIS — E870 Hyperosmolality and hypernatremia: Secondary | ICD-10-CM | POA: Diagnosis not present

## 2019-07-19 DIAGNOSIS — R402222 Coma scale, best verbal response, incomprehensible words, at arrival to emergency department: Secondary | ICD-10-CM | POA: Diagnosis present

## 2019-07-19 DIAGNOSIS — S272XXA Traumatic hemopneumothorax, initial encounter: Secondary | ICD-10-CM | POA: Diagnosis present

## 2019-07-19 DIAGNOSIS — Y92410 Unspecified street and highway as the place of occurrence of the external cause: Secondary | ICD-10-CM

## 2019-07-19 DIAGNOSIS — R402332 Coma scale, best motor response, abnormal, at arrival to emergency department: Secondary | ICD-10-CM | POA: Diagnosis present

## 2019-07-19 DIAGNOSIS — S0101XA Laceration without foreign body of scalp, initial encounter: Secondary | ICD-10-CM | POA: Diagnosis present

## 2019-07-19 DIAGNOSIS — S32591A Other specified fracture of right pubis, initial encounter for closed fracture: Secondary | ICD-10-CM | POA: Diagnosis present

## 2019-07-19 DIAGNOSIS — Z7189 Other specified counseling: Secondary | ICD-10-CM

## 2019-07-19 DIAGNOSIS — J156 Pneumonia due to other aerobic Gram-negative bacteria: Secondary | ICD-10-CM | POA: Diagnosis present

## 2019-07-19 DIAGNOSIS — A4101 Sepsis due to Methicillin susceptible Staphylococcus aureus: Secondary | ICD-10-CM | POA: Diagnosis not present

## 2019-07-19 DIAGNOSIS — Z20822 Contact with and (suspected) exposure to covid-19: Secondary | ICD-10-CM | POA: Diagnosis present

## 2019-07-19 DIAGNOSIS — Z79899 Other long term (current) drug therapy: Secondary | ICD-10-CM

## 2019-07-19 DIAGNOSIS — R402112 Coma scale, eyes open, never, at arrival to emergency department: Secondary | ICD-10-CM | POA: Diagnosis present

## 2019-07-19 DIAGNOSIS — R6521 Severe sepsis with septic shock: Secondary | ICD-10-CM | POA: Diagnosis not present

## 2019-07-19 DIAGNOSIS — Z66 Do not resuscitate: Secondary | ICD-10-CM | POA: Diagnosis not present

## 2019-07-19 DIAGNOSIS — R7881 Bacteremia: Secondary | ICD-10-CM

## 2019-07-19 DIAGNOSIS — I82622 Acute embolism and thrombosis of deep veins of left upper extremity: Secondary | ICD-10-CM | POA: Diagnosis not present

## 2019-07-19 DIAGNOSIS — R739 Hyperglycemia, unspecified: Secondary | ICD-10-CM | POA: Diagnosis not present

## 2019-07-19 DIAGNOSIS — I313 Pericardial effusion (noninflammatory): Secondary | ICD-10-CM | POA: Diagnosis present

## 2019-07-19 DIAGNOSIS — G9340 Encephalopathy, unspecified: Secondary | ICD-10-CM | POA: Diagnosis not present

## 2019-07-19 DIAGNOSIS — I330 Acute and subacute infective endocarditis: Secondary | ICD-10-CM | POA: Diagnosis present

## 2019-07-19 DIAGNOSIS — Z6825 Body mass index (BMI) 25.0-25.9, adult: Secondary | ICD-10-CM

## 2019-07-19 DIAGNOSIS — S069XAA Unspecified intracranial injury with loss of consciousness status unknown, initial encounter: Secondary | ICD-10-CM | POA: Diagnosis present

## 2019-07-19 DIAGNOSIS — S066X9A Traumatic subarachnoid hemorrhage with loss of consciousness of unspecified duration, initial encounter: Principal | ICD-10-CM | POA: Diagnosis present

## 2019-07-19 DIAGNOSIS — R339 Retention of urine, unspecified: Secondary | ICD-10-CM | POA: Diagnosis not present

## 2019-07-19 DIAGNOSIS — R55 Syncope and collapse: Secondary | ICD-10-CM | POA: Diagnosis present

## 2019-07-19 DIAGNOSIS — I059 Rheumatic mitral valve disease, unspecified: Secondary | ICD-10-CM | POA: Diagnosis present

## 2019-07-19 DIAGNOSIS — E46 Unspecified protein-calorie malnutrition: Secondary | ICD-10-CM | POA: Diagnosis present

## 2019-07-19 DIAGNOSIS — E876 Hypokalemia: Secondary | ICD-10-CM | POA: Diagnosis not present

## 2019-07-19 DIAGNOSIS — J342 Deviated nasal septum: Secondary | ICD-10-CM | POA: Diagnosis present

## 2019-07-19 DIAGNOSIS — S065X9A Traumatic subdural hemorrhage with loss of consciousness of unspecified duration, initial encounter: Secondary | ICD-10-CM | POA: Diagnosis present

## 2019-07-19 DIAGNOSIS — S42101A Fracture of unspecified part of scapula, right shoulder, initial encounter for closed fracture: Secondary | ICD-10-CM | POA: Diagnosis present

## 2019-07-19 DIAGNOSIS — R1310 Dysphagia, unspecified: Secondary | ICD-10-CM | POA: Diagnosis present

## 2019-07-19 DIAGNOSIS — S022XXA Fracture of nasal bones, initial encounter for closed fracture: Secondary | ICD-10-CM | POA: Diagnosis present

## 2019-07-19 DIAGNOSIS — Z7401 Bed confinement status: Secondary | ICD-10-CM

## 2019-07-19 LAB — SAMPLE TO BLOOD BANK

## 2019-07-19 LAB — ETHANOL: Alcohol, Ethyl (B): <10 mg/dL (ref <10)

## 2019-07-19 LAB — POCT I-STAT 7, (LYTES, BLD GAS, ICA,H+H)
Acid-base deficit: 1 mmol/L (ref 0.0–2.0)
Bicarbonate: 25.1 mmol/L (ref 20.0–28.0)
Calcium, Ion: 1.2 mmol/L (ref 1.15–1.40)
HCT: 34 % — ABNORMAL LOW (ref 39.0–52.0)
Hemoglobin: 11.6 g/dL — ABNORMAL LOW (ref 13.0–17.0)
O2 Saturation: 100 %
Patient temperature: 98.7
Potassium: 3.5 mmol/L (ref 3.5–5.1)
Sodium: 139 mmol/L (ref 135–145)
TCO2: 26 mmol/L (ref 22–32)
pCO2 arterial: 45.3 mmHg (ref 32.0–48.0)
pH, Arterial: 7.352 (ref 7.350–7.450)
pO2, Arterial: 216 mmHg — ABNORMAL HIGH (ref 83.0–108.0)

## 2019-07-19 LAB — CBC
HCT: 40.9 % (ref 39.0–52.0)
Hemoglobin: 13.3 g/dL (ref 13.0–17.0)
MCH: 28.7 pg (ref 26.0–34.0)
MCHC: 32.5 g/dL (ref 30.0–36.0)
MCV: 88.1 fL (ref 80.0–100.0)
Platelets: 245 10*3/uL (ref 150–400)
RBC: 4.64 MIL/uL (ref 4.22–5.81)
RDW: 13 % (ref 11.5–15.5)
WBC: 13.5 10*3/uL — ABNORMAL HIGH (ref 4.0–10.5)
nRBC: 0 % (ref 0.0–0.2)

## 2019-07-19 LAB — I-STAT CHEM 8, ED
BUN: 16 mg/dL (ref 8–23)
Calcium, Ion: 1.11 mmol/L — ABNORMAL LOW (ref 1.15–1.40)
Chloride: 105 mmol/L (ref 98–111)
Creatinine, Ser: 1.1 mg/dL (ref 0.61–1.24)
Glucose, Bld: 121 mg/dL — ABNORMAL HIGH (ref 70–99)
HCT: 38 % — ABNORMAL LOW (ref 39.0–52.0)
Hemoglobin: 12.9 g/dL — ABNORMAL LOW (ref 13.0–17.0)
Potassium: 3.6 mmol/L (ref 3.5–5.1)
Sodium: 141 mmol/L (ref 135–145)
TCO2: 25 mmol/L (ref 22–32)

## 2019-07-19 LAB — COMPREHENSIVE METABOLIC PANEL
ALT: 20 U/L (ref 0–44)
AST: 26 U/L (ref 15–41)
Albumin: 3.6 g/dL (ref 3.5–5.0)
Alkaline Phosphatase: 78 U/L (ref 38–126)
Anion gap: 9 (ref 5–15)
BUN: 15 mg/dL (ref 8–23)
CO2: 22 mmol/L (ref 22–32)
Calcium: 8.7 mg/dL — ABNORMAL LOW (ref 8.9–10.3)
Chloride: 106 mmol/L (ref 98–111)
Creatinine, Ser: 1.14 mg/dL (ref 0.61–1.24)
GFR calc Af Amer: >60 mL/min (ref >60)
GFR calc non Af Amer: >60 mL/min (ref >60)
Glucose, Bld: 129 mg/dL — ABNORMAL HIGH (ref 70–99)
Potassium: 3.7 mmol/L (ref 3.5–5.1)
Sodium: 137 mmol/L (ref 135–145)
Total Bilirubin: 0.7 mg/dL (ref 0.3–1.2)
Total Protein: 6.3 g/dL — ABNORMAL LOW (ref 6.5–8.1)

## 2019-07-19 LAB — PROTIME-INR
INR: 1.1 (ref 0.8–1.2)
Prothrombin Time: 14.2 seconds (ref 11.4–15.2)

## 2019-07-19 LAB — RESPIRATORY PANEL BY RT PCR (FLU A&B, COVID)
Influenza A by PCR: NEGATIVE
Influenza B by PCR: NEGATIVE
SARS Coronavirus 2 by RT PCR: NEGATIVE

## 2019-07-19 LAB — LACTIC ACID, PLASMA: Lactic Acid, Venous: 1.8 mmol/L (ref 0.5–1.9)

## 2019-07-19 MED ORDER — FENTANYL CITRATE (PF) 100 MCG/2ML IJ SOLN
INTRAMUSCULAR | Status: AC
  Administered 2019-07-19: 50 ug via INTRAVENOUS
  Filled 2019-07-19: qty 2

## 2019-07-19 MED ORDER — ETOMIDATE 2 MG/ML IV SOLN
INTRAVENOUS | Status: AC | PRN
  Administered 2019-07-19: 20 mg via INTRAVENOUS

## 2019-07-19 MED ORDER — FENTANYL CITRATE (PF) 100 MCG/2ML IJ SOLN: 50.0000 ug | Freq: Once | INTRAMUSCULAR | Status: AC

## 2019-07-19 MED ORDER — PROPOFOL 1000 MG/100ML IV EMUL
5.0000 ug/kg/min | INTRAVENOUS | Status: DC
  Administered 2019-07-19: 30 ug/kg/min via INTRAVENOUS

## 2019-07-19 MED ORDER — POLYETHYLENE GLYCOL 3350 17 G PO PACK
17.0000 g | PACK | Freq: Every day | ORAL | Status: DC
  Administered 2019-07-21: 17 g via ORAL
  Filled 2019-07-19 (×3): qty 1

## 2019-07-19 MED ORDER — BISACODYL 10 MG RE SUPP: 10.0000 mg | Freq: Every day | RECTAL | Status: DC | PRN

## 2019-07-19 MED ORDER — PROPOFOL 1000 MG/100ML IV EMUL
INTRAVENOUS | Status: AC
  Administered 2019-07-19: 21:00:00 15 ug/kg/min via INTRAVENOUS
  Filled 2019-07-19: qty 100

## 2019-07-19 MED ORDER — SUCCINYLCHOLINE CHLORIDE 20 MG/ML IJ SOLN
INTRAMUSCULAR | Status: AC | PRN
  Administered 2019-07-19: 120 mg via INTRAVENOUS

## 2019-07-19 MED ORDER — VECURONIUM BROMIDE 10 MG IV SOLR
INTRAVENOUS | Status: AC | PRN
  Administered 2019-07-19: 10 mg via INTRAVENOUS

## 2019-07-19 MED ORDER — CHLORHEXIDINE GLUCONATE 0.12% ORAL RINSE (MEDLINE KIT)
15.0000 mL | Freq: Two times a day (BID) | OROMUCOSAL | Status: DC
  Administered 2019-07-19 – 2019-08-10 (×44): 15 mL via OROMUCOSAL

## 2019-07-19 MED ORDER — DOCUSATE SODIUM 50 MG/5ML PO LIQD
100.0000 mg | Freq: Two times a day (BID) | ORAL | Status: DC
  Filled 2019-07-19 (×2): qty 10

## 2019-07-19 MED ORDER — FENTANYL BOLUS VIA INFUSION
50.0000 ug | INTRAVENOUS | Status: DC | PRN
  Administered 2019-07-25 (×2): 100 ug via INTRAVENOUS
  Filled 2019-07-19: qty 50

## 2019-07-19 MED ORDER — CHLORHEXIDINE GLUCONATE CLOTH 2 % EX PADS
6.0000 | MEDICATED_PAD | Freq: Every day | CUTANEOUS | Status: DC
  Administered 2019-07-20 – 2019-08-09 (×17): 6 via TOPICAL

## 2019-07-19 MED ORDER — METOPROLOL TARTRATE 5 MG/5ML IV SOLN
5.0000 mg | Freq: Four times a day (QID) | INTRAVENOUS | Status: DC | PRN
  Administered 2019-07-28 – 2019-08-05 (×3): 5 mg via INTRAVENOUS
  Filled 2019-07-19 (×3): qty 5

## 2019-07-19 MED ORDER — ORAL CARE MOUTH RINSE
15.0000 mL | OROMUCOSAL | Status: DC
  Administered 2019-07-19 – 2019-08-10 (×210): 15 mL via OROMUCOSAL

## 2019-07-19 MED ORDER — ONDANSETRON HCL 4 MG/2ML IJ SOLN: 4.0000 mg | Freq: Four times a day (QID) | INTRAMUSCULAR | Status: DC | PRN

## 2019-07-19 MED ORDER — IOHEXOL 300 MG/ML  SOLN
100.0000 mL | Freq: Once | INTRAMUSCULAR | Status: AC | PRN
  Administered 2019-07-19: 100 mL via INTRAVENOUS

## 2019-07-19 MED ORDER — SODIUM CHLORIDE 0.9 % IV SOLN: INTRAVENOUS | Status: DC

## 2019-07-19 MED ORDER — FENTANYL CITRATE (PF) 100 MCG/2ML IJ SOLN
50.0000 ug | Freq: Once | INTRAMUSCULAR | Status: DC
  Filled 2019-07-19: qty 2

## 2019-07-19 MED ORDER — FENTANYL 2500MCG IN NS 250ML (10MCG/ML) PREMIX INFUSION
50.0000 ug/h | INTRAVENOUS | Status: DC
  Administered 2019-07-19: 50 ug/h via INTRAVENOUS
  Administered 2019-07-21: 75 ug/h via INTRAVENOUS
  Administered 2019-07-22 – 2019-07-23 (×2): 100 ug/h via INTRAVENOUS
  Administered 2019-07-24: 125 ug/h via INTRAVENOUS
  Administered 2019-07-25: 05:00:00 100 ug/h via INTRAVENOUS
  Administered 2019-07-26: 50 ug/h via INTRAVENOUS
  Filled 2019-07-19 (×8): qty 250

## 2019-07-19 MED ORDER — ONDANSETRON 4 MG PO TBDP: 4.0000 mg | ORAL_TABLET | Freq: Four times a day (QID) | ORAL | Status: DC | PRN

## 2019-07-19 MED ORDER — PROPOFOL 1000 MG/100ML IV EMUL
0.0000 ug/kg/min | INTRAVENOUS | Status: DC
  Administered 2019-07-19: 50 ug/kg/min via INTRAVENOUS
  Administered 2019-07-20: 06:00:00 40 ug/kg/min via INTRAVENOUS
  Administered 2019-07-20: 17:00:00 15 ug/kg/min via INTRAVENOUS
  Administered 2019-07-20: 09:00:00 30 ug/kg/min via INTRAVENOUS
  Administered 2019-07-20: 25 ug/kg/min via INTRAVENOUS
  Administered 2019-07-20: 01:00:00 40 ug/kg/min via INTRAVENOUS
  Administered 2019-07-21 (×2): 20 ug/kg/min via INTRAVENOUS
  Administered 2019-07-22: 15 ug/kg/min via INTRAVENOUS
  Filled 2019-07-19 (×9): qty 100

## 2019-07-19 NOTE — ED Notes (Signed)
Pt transported to CT with RN Pattricia Boss, Trauma MD, and RT.
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
## 2019-07-19 NOTE — ED Notes (Signed)
ICU  Called neurosurg. to Dr Dwain Sarna

## 2019-07-19 NOTE — Progress Notes (Signed)
Transported to 4N17 without incidence.
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
## 2019-07-19 NOTE — Progress Notes (Signed)
Transported to CT scan
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
## 2019-07-20 ENCOUNTER — Inpatient Hospital Stay (HOSPITAL_COMMUNITY): Payer: Medicaid Other

## 2019-07-20 LAB — URINALYSIS, ROUTINE W REFLEX MICROSCOPIC
Bacteria, UA: NONE SEEN
Bilirubin Urine: NEGATIVE
Glucose, UA: NEGATIVE mg/dL
Hgb urine dipstick: NEGATIVE
Ketones, ur: 5 mg/dL — AB
Leukocytes,Ua: NEGATIVE
Nitrite: NEGATIVE
Protein, ur: NEGATIVE mg/dL
Specific Gravity, Urine: >1.046 — ABNORMAL HIGH (ref 1.005–1.030)
pH: 5 (ref 5.0–8.0)

## 2019-07-20 LAB — BASIC METABOLIC PANEL WITH GFR
Anion gap: 8 (ref 5–15)
BUN: 12 mg/dL (ref 8–23)
CO2: 22 mmol/L (ref 22–32)
Calcium: 8.5 mg/dL — ABNORMAL LOW (ref 8.9–10.3)
Chloride: 108 mmol/L (ref 98–111)
Creatinine, Ser: 0.93 mg/dL (ref 0.61–1.24)
GFR calc Af Amer: >60 mL/min
GFR calc non Af Amer: >60 mL/min
Glucose, Bld: 117 mg/dL — ABNORMAL HIGH (ref 70–99)
Potassium: 4 mmol/L (ref 3.5–5.1)
Sodium: 138 mmol/L (ref 135–145)

## 2019-07-20 LAB — CBC
HCT: 39.1 % (ref 39.0–52.0)
Hemoglobin: 12.6 g/dL — ABNORMAL LOW (ref 13.0–17.0)
MCH: 28.4 pg (ref 26.0–34.0)
MCHC: 32.2 g/dL (ref 30.0–36.0)
MCV: 88.3 fL (ref 80.0–100.0)
Platelets: 182 10*3/uL (ref 150–400)
RBC: 4.43 MIL/uL (ref 4.22–5.81)
RDW: 13.2 % (ref 11.5–15.5)
WBC: 11 10*3/uL — ABNORMAL HIGH (ref 4.0–10.5)
nRBC: 0 % (ref 0.0–0.2)

## 2019-07-20 LAB — POCT I-STAT 7, (LYTES, BLD GAS, ICA,H+H)
Bicarbonate: 24.5 mmol/L (ref 20.0–28.0)
Calcium, Ion: 1.21 mmol/L (ref 1.15–1.40)
HCT: 33 % — ABNORMAL LOW (ref 39.0–52.0)
Hemoglobin: 11.2 g/dL — ABNORMAL LOW (ref 13.0–17.0)
O2 Saturation: 99 %
Patient temperature: 99.6
Potassium: 4.1 mmol/L (ref 3.5–5.1)
Sodium: 140 mmol/L (ref 135–145)
TCO2: 26 mmol/L (ref 22–32)
pCO2 arterial: 39.1 mmHg (ref 32.0–48.0)
pH, Arterial: 7.407 (ref 7.350–7.450)
pO2, Arterial: 137 mmHg — ABNORMAL HIGH (ref 83.0–108.0)

## 2019-07-20 LAB — PHOSPHORUS
Phosphorus: 2.8 mg/dL (ref 2.5–4.6)
Phosphorus: 2.6 mg/dL (ref 2.5–4.6)

## 2019-07-20 LAB — MAGNESIUM
Magnesium: 2 mg/dL (ref 1.7–2.4)
Magnesium: 2 mg/dL (ref 1.7–2.4)

## 2019-07-20 LAB — TRIGLYCERIDES: Triglycerides: 58 mg/dL (ref <150)

## 2019-07-20 LAB — MRSA PCR SCREENING: MRSA by PCR: NEGATIVE

## 2019-07-20 LAB — GLUCOSE, CAPILLARY: Glucose-Capillary: 106 mg/dL — ABNORMAL HIGH (ref 70–99)

## 2019-07-20 MED ORDER — PIVOT 1.5 CAL PO LIQD
1000.0000 mL | ORAL | Status: DC
  Administered 2019-07-20: 10:00:00 1000 mL

## 2019-07-20 MED ORDER — DOCUSATE SODIUM 50 MG/5ML PO LIQD
100.0000 mg | Freq: Two times a day (BID) | ORAL | Status: DC
  Administered 2019-07-20 – 2019-08-07 (×26): 100 mg
  Filled 2019-07-20 (×26): qty 10

## 2019-07-20 MED ORDER — PRO-STAT SUGAR FREE PO LIQD
60.0000 mL | Freq: Two times a day (BID) | ORAL | Status: DC
  Administered 2019-07-20 – 2019-07-24 (×9): 60 mL
  Filled 2019-07-20 (×10): qty 60

## 2019-07-20 MED ORDER — ADULT MULTIVITAMIN W/MINERALS CH
1.0000 | ORAL_TABLET | Freq: Every day | ORAL | Status: DC
  Administered 2019-07-20 – 2019-08-08 (×18): 1
  Filled 2019-07-20 (×19): qty 1

## 2019-07-20 MED ORDER — PRO-STAT SUGAR FREE PO LIQD
30.0000 mL | Freq: Two times a day (BID) | ORAL | Status: DC
  Administered 2019-07-20: 30 mL
  Filled 2019-07-20: qty 30

## 2019-07-20 MED ORDER — VITAL HIGH PROTEIN PO LIQD: 1000.0000 mL | ORAL | Status: DC

## 2019-07-20 MED ORDER — PANTOPRAZOLE SODIUM 40 MG PO PACK
40.0000 mg | PACK | Freq: Every day | ORAL | Status: DC
  Administered 2019-07-20 – 2019-08-10 (×20): 40 mg
  Filled 2019-07-20 (×21): qty 20

## 2019-07-20 MED ORDER — PIVOT 1.5 CAL PO LIQD
1000.0000 mL | ORAL | Status: DC
  Administered 2019-07-21: 15:00:00 1000 mL

## 2019-07-20 NOTE — Progress Notes (Signed)
Pt transported to CT and back to ICU room with no complications noted.
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
## 2019-07-20 NOTE — Consult Note (Signed)
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 displaced rib fractures are noted on the right. Possible minimal right apical pneumothorax is noted. No effusion is noted. IMPRESSION: Endotracheal and nasogastric tubes are in good position. Minimal left apical pneumothorax is noted. Large amount of subcutaneous emphysema is seen involving the right supraclavicular and right lateral chest wall region. Multiple displaced rib fractures are noted on the right. Possible minimal right apical pneumothorax is noted. Electronically Signed   By:  Lupita Raider M.D.   On: 08/01/2019 21:36    Review of Systems Blood pressure 99/71, pulse 90, temperature 99.6 F (37.6 C), temperature source Axillary, resp. rate 19, height 5\' 6"  (1.676 m), weight 83.9 kg, SpO2 98 %. Physical Exam  Constitutional: He appears well-developed.  HENT:  There is no  palpable or visual defect in the frontal area. Some abrasions on the left. Nose is without septal hematoma, the septum is deviated to the right anteriorly. ETT in place.   Eyes: Conjunctivae are normal.  Neck:  c collar in place    Assessment/Plan: Frontal sinus fracture- this is minimally displaced on the anterior table but no palpable or visual defect. I do not think this will need any intervention. The nose has significant septal deviation and not sure if this is new or old. Will need to discuss with the patient about his nose after extubated and awake.   07/20/2019, 11:21 AM

## 2019-07-20 NOTE — Progress Notes (Signed)
Patient ID: Stephen Juarez, male   DOB: Feb 22, 1956, 64 y.o.   MRN: [id number] I called his son in law, Dr. Delrae Alfred, who is an anesthesiologist in [place], and updated him on Stephen Juarez's injuries and the plan of care.  Violeta Gelinas, MD, MPH, FACS Please use [email or web address] to contact on call provider
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
## 2019-07-20 NOTE — Social Work (Signed)
CSW was unable to complete sbirt due to pt being intubated. CSW will attempt again at more appropriate time.   Jimmy Picket, Theresia Majors, [place] Clinical Social Worker [phone]

## 2019-07-21 ENCOUNTER — Inpatient Hospital Stay (HOSPITAL_COMMUNITY): Payer: Medicaid Other

## 2019-07-21 LAB — BASIC METABOLIC PANEL
Anion gap: 8 (ref 5–15)
BUN: 14 mg/dL (ref 8–23)
CO2: 24 mmol/L (ref 22–32)
Calcium: 8.4 mg/dL — ABNORMAL LOW (ref 8.9–10.3)
Chloride: 105 mmol/L (ref 98–111)
Creatinine, Ser: 0.76 mg/dL (ref 0.61–1.24)
GFR calc Af Amer: >60 mL/min (ref >60)
GFR calc non Af Amer: >60 mL/min (ref >60)
Glucose, Bld: 123 mg/dL — ABNORMAL HIGH (ref 70–99)
Potassium: 3.9 mmol/L (ref 3.5–5.1)
Sodium: 137 mmol/L (ref 135–145)

## 2019-07-21 LAB — MAGNESIUM
Magnesium: 2.2 mg/dL (ref 1.7–2.4)
Magnesium: 2.1 mg/dL (ref 1.7–2.4)

## 2019-07-21 LAB — GLUCOSE, CAPILLARY
Glucose-Capillary: 114 mg/dL — ABNORMAL HIGH (ref 70–99)
Glucose-Capillary: 96 mg/dL (ref 70–99)
Glucose-Capillary: 102 mg/dL — ABNORMAL HIGH (ref 70–99)
Glucose-Capillary: 100 mg/dL — ABNORMAL HIGH (ref 70–99)
Glucose-Capillary: 120 mg/dL — ABNORMAL HIGH (ref 70–99)
Glucose-Capillary: 118 mg/dL — ABNORMAL HIGH (ref 70–99)

## 2019-07-21 LAB — CBC
HCT: 35 % — ABNORMAL LOW (ref 39.0–52.0)
Hemoglobin: 11.2 g/dL — ABNORMAL LOW (ref 13.0–17.0)
MCH: 29 pg (ref 26.0–34.0)
MCHC: 32 g/dL (ref 30.0–36.0)
MCV: 90.7 fL (ref 80.0–100.0)
Platelets: 150 10*3/uL (ref 150–400)
RBC: 3.86 MIL/uL — ABNORMAL LOW (ref 4.22–5.81)
RDW: 13.3 % (ref 11.5–15.5)
WBC: 11.6 10*3/uL — ABNORMAL HIGH (ref 4.0–10.5)
nRBC: 0 % (ref 0.0–0.2)

## 2019-07-21 LAB — PHOSPHORUS
Phosphorus: 2 mg/dL — ABNORMAL LOW (ref 2.5–4.6)
Phosphorus: 1.8 mg/dL — ABNORMAL LOW (ref 2.5–4.6)

## 2019-07-21 LAB — TRIGLYCERIDES: Triglycerides: 70 mg/dL (ref <150)
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
## 2019-07-22 ENCOUNTER — Inpatient Hospital Stay (HOSPITAL_COMMUNITY): Payer: Medicaid Other

## 2019-07-22 DIAGNOSIS — I361 Nonrheumatic tricuspid (valve) insufficiency: Secondary | ICD-10-CM

## 2019-07-22 LAB — ECHOCARDIOGRAM COMPLETE
Height: 66 in
Weight: 2444.46 oz

## 2019-07-22 LAB — BASIC METABOLIC PANEL
Anion gap: 5 (ref 5–15)
BUN: 15 mg/dL (ref 8–23)
CO2: 24 mmol/L (ref 22–32)
Calcium: 8.4 mg/dL — ABNORMAL LOW (ref 8.9–10.3)
Chloride: 110 mmol/L (ref 98–111)
Creatinine, Ser: 0.8 mg/dL (ref 0.61–1.24)
GFR calc Af Amer: >60 mL/min (ref >60)
GFR calc non Af Amer: >60 mL/min (ref >60)
Glucose, Bld: 127 mg/dL — ABNORMAL HIGH (ref 70–99)
Potassium: 4.1 mmol/L (ref 3.5–5.1)
Sodium: 139 mmol/L (ref 135–145)

## 2019-07-22 LAB — CBC
HCT: 34.3 % — ABNORMAL LOW (ref 39.0–52.0)
Hemoglobin: 10.9 g/dL — ABNORMAL LOW (ref 13.0–17.0)
MCH: 28.5 pg (ref 26.0–34.0)
MCHC: 31.8 g/dL (ref 30.0–36.0)
MCV: 89.8 fL (ref 80.0–100.0)
Platelets: 142 10*3/uL — ABNORMAL LOW (ref 150–400)
RBC: 3.82 MIL/uL — ABNORMAL LOW (ref 4.22–5.81)
RDW: 13.1 % (ref 11.5–15.5)
WBC: 13.8 10*3/uL — ABNORMAL HIGH (ref 4.0–10.5)
nRBC: 0 % (ref 0.0–0.2)

## 2019-07-22 LAB — GLUCOSE, CAPILLARY
Glucose-Capillary: 107 mg/dL — ABNORMAL HIGH (ref 70–99)
Glucose-Capillary: 117 mg/dL — ABNORMAL HIGH (ref 70–99)
Glucose-Capillary: 140 mg/dL — ABNORMAL HIGH (ref 70–99)
Glucose-Capillary: 122 mg/dL — ABNORMAL HIGH (ref 70–99)
Glucose-Capillary: 104 mg/dL — ABNORMAL HIGH (ref 70–99)
Glucose-Capillary: 125 mg/dL — ABNORMAL HIGH (ref 70–99)

## 2019-07-22 LAB — TRIGLYCERIDES: Triglycerides: 84 mg/dL (ref <150)

## 2019-07-22 LAB — PHOSPHORUS: Phosphorus: 1.6 mg/dL — ABNORMAL LOW (ref 2.5–4.6)

## 2019-07-22 MED ORDER — LEVETIRACETAM IN NACL 500 MG/100ML IV SOLN
500.0000 mg | Freq: Two times a day (BID) | INTRAVENOUS | Status: DC
  Administered 2019-07-22 – 2019-07-26 (×10): 500 mg via INTRAVENOUS
  Filled 2019-07-22 (×10): qty 100

## 2019-07-22 MED ORDER — DEXMEDETOMIDINE HCL IN NACL 400 MCG/100ML IV SOLN
0.4000 ug/kg/h | INTRAVENOUS | Status: AC
  Administered 2019-07-24: 0.8 ug/kg/h via INTRAVENOUS
  Administered 2019-07-26 (×2): 1.2 ug/kg/h via INTRAVENOUS
  Administered 2019-07-26: 0.4 ug/kg/h via INTRAVENOUS
  Administered 2019-07-27: 1.2 ug/kg/h via INTRAVENOUS
  Administered 2019-07-27: 09:00:00 0.8 ug/kg/h via INTRAVENOUS
  Filled 2019-07-22: qty 100
  Filled 2019-07-22: qty 200
  Filled 2019-07-22 (×4): qty 100

## 2019-07-22 MED ORDER — ENOXAPARIN SODIUM 30 MG/0.3ML ~~LOC~~ SOLN
30.0000 mg | Freq: Two times a day (BID) | SUBCUTANEOUS | Status: DC
  Administered 2019-07-22 – 2019-08-02 (×22): 30 mg via SUBCUTANEOUS
  Filled 2019-07-22 (×23): qty 0.3

## 2019-07-22 MED ORDER — LIDOCAINE HCL (PF) 1 % IJ SOLN
5.0000 mL | Freq: Once | INTRAMUSCULAR | Status: AC
  Administered 2019-07-22: 17:00:00 5 mL via SUBCUTANEOUS

## 2019-07-22 MED ORDER — OXYCODONE HCL 5 MG/5ML PO SOLN
5.0000 mg | ORAL | Status: DC | PRN
  Administered 2019-07-27 – 2019-08-02 (×12): 10 mg
  Filled 2019-07-22 (×12): qty 10

## 2019-07-22 MED ORDER — LIDOCAINE HCL (PF) 1 % IJ SOLN
INTRAMUSCULAR | Status: AC
  Filled 2019-07-22: qty 5

## 2019-07-22 MED ORDER — POLYETHYLENE GLYCOL 3350 17 G PO PACK
17.0000 g | PACK | Freq: Every day | ORAL | Status: DC
  Administered 2019-07-22 – 2019-07-24 (×3): 17 g
  Filled 2019-07-22 (×3): qty 1

## 2019-07-22 MED ORDER — SODIUM PHOSPHATES 45 MMOLE/15ML IV SOLN
30.0000 mmol | Freq: Once | INTRAVENOUS | Status: AC
  Administered 2019-07-22: 30 mmol via INTRAVENOUS
  Filled 2019-07-22: qty 10

## 2019-07-22 MED ORDER — ACETAMINOPHEN 500 MG PO TABS
1000.0000 mg | ORAL_TABLET | Freq: Four times a day (QID) | ORAL | Status: DC
  Administered 2019-07-22 – 2019-07-23 (×4): 1000 mg
  Administered 2019-07-23: 500 mg
  Administered 2019-07-23 – 2019-08-10 (×65): 1000 mg
  Filled 2019-07-22 (×72): qty 2

## 2019-07-22 MED ORDER — METHOCARBAMOL 1000 MG/10ML IJ SOLN
1000.0000 mg | Freq: Three times a day (TID) | INTRAVENOUS | Status: DC
  Administered 2019-07-22 – 2019-07-26 (×13): 1000 mg via INTRAVENOUS
  Filled 2019-07-22 (×15): qty 10
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
## 2019-07-22 NOTE — Progress Notes (Signed)
Transported patient on ventilator to and from CT without complications.  Patient was suctioned before and after trip.  VS stable throughout.  Will continue to monitor.

## 2019-07-22 NOTE — Procedures (Signed)
   Procedure Note  Date: 07/22/2019  Procedure: tube thoracostomy--right    Pre-op diagnosis: right pneumohemothorax  Post-op diagnosis: same  Surgeon: Diamantina Monks, MD  Anesthesia: local   EBL: <5cc procedural Drains/Implants: 71F chest tube Specimen: none  Description of procedure: Time-out was performed verifying correct patient, procedure, site, laterality, and signature of informed consent. Five cc's of local anesthetic was infiltrated into the tissues just over the fourth intercostal space.  A longitudinal incision was made parallel to the rib at the fourth intercostal space. This incision was deepened down through the muscle until the pleural cavity was entered. An audible air rush was encountered upon entry and a 71F chest tube was inserted through this tract.   The tube was secured at 16cm at the skin with suture and connected to an atrium at -20cm water wall suction. Immediate output from the chest tube was 0cc. The site was dressed with xeroform, gauze, and tape. The patient tolerated the procedure well. There were no complications. Follow up chest x-ray was ordered to confirm tube positioning, complete evacuation, and complete lung re-expansion.    Diamantina Monks, MD General and Trauma Surgery [facility]

## 2019-07-22 NOTE — Progress Notes (Signed)
  Echocardiogram 2D Echocardiogram has been performed.  Stephen Juarez A Carmesha Morocco 07/22/2019, 3:34 PM
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
## 2019-07-23 ENCOUNTER — Inpatient Hospital Stay (HOSPITAL_COMMUNITY): Payer: Medicaid Other

## 2019-07-23 DIAGNOSIS — S270XXA Traumatic pneumothorax, initial encounter: Secondary | ICD-10-CM

## 2019-07-23 DIAGNOSIS — S069X6A Unspecified intracranial injury with loss of consciousness greater than 24 hours without return to pre-existing conscious level with patient surviving, initial encounter: Secondary | ICD-10-CM

## 2019-07-23 DIAGNOSIS — T07XXXA Unspecified multiple injuries, initial encounter: Secondary | ICD-10-CM

## 2019-07-23 DIAGNOSIS — S2241XA Multiple fractures of ribs, right side, initial encounter for closed fracture: Secondary | ICD-10-CM

## 2019-07-23 DIAGNOSIS — S069X9A Unspecified intracranial injury with loss of consciousness of unspecified duration, initial encounter: Secondary | ICD-10-CM

## 2019-07-23 LAB — GLUCOSE, CAPILLARY
Glucose-Capillary: 120 mg/dL — ABNORMAL HIGH (ref 70–99)
Glucose-Capillary: 105 mg/dL — ABNORMAL HIGH (ref 70–99)
Glucose-Capillary: 149 mg/dL — ABNORMAL HIGH (ref 70–99)
Glucose-Capillary: 118 mg/dL — ABNORMAL HIGH (ref 70–99)
Glucose-Capillary: 129 mg/dL — ABNORMAL HIGH (ref 70–99)
Glucose-Capillary: 162 mg/dL — ABNORMAL HIGH (ref 70–99)

## 2019-07-23 LAB — CBC
HCT: 31.3 % — ABNORMAL LOW (ref 39.0–52.0)
Hemoglobin: 9.9 g/dL — ABNORMAL LOW (ref 13.0–17.0)
MCH: 28.9 pg (ref 26.0–34.0)
MCHC: 31.6 g/dL (ref 30.0–36.0)
MCV: 91.3 fL (ref 80.0–100.0)
Platelets: 143 10*3/uL — ABNORMAL LOW (ref 150–400)
RBC: 3.43 MIL/uL — ABNORMAL LOW (ref 4.22–5.81)
RDW: 13.1 % (ref 11.5–15.5)
WBC: 11.1 10*3/uL — ABNORMAL HIGH (ref 4.0–10.5)
nRBC: 0 % (ref 0.0–0.2)

## 2019-07-23 LAB — PHOSPHORUS: Phosphorus: 2.3 mg/dL — ABNORMAL LOW (ref 2.5–4.6)

## 2019-07-23 LAB — BASIC METABOLIC PANEL
Anion gap: 5 (ref 5–15)
BUN: 19 mg/dL (ref 8–23)
CO2: 27 mmol/L (ref 22–32)
Calcium: 8.3 mg/dL — ABNORMAL LOW (ref 8.9–10.3)
Chloride: 110 mmol/L (ref 98–111)
Creatinine, Ser: 0.77 mg/dL (ref 0.61–1.24)
GFR calc Af Amer: >60 mL/min (ref >60)
GFR calc non Af Amer: >60 mL/min (ref >60)
Glucose, Bld: 123 mg/dL — ABNORMAL HIGH (ref 70–99)
Potassium: 4 mmol/L (ref 3.5–5.1)
Sodium: 142 mmol/L (ref 135–145)

## 2019-07-23 LAB — POCT I-STAT 7, (LYTES, BLD GAS, ICA,H+H)
Acid-Base Excess: 4 mmol/L — ABNORMAL HIGH (ref 0.0–2.0)
Bicarbonate: 29.1 mmol/L — ABNORMAL HIGH (ref 20.0–28.0)
Calcium, Ion: 1.29 mmol/L (ref 1.15–1.40)
HCT: 30 % — ABNORMAL LOW (ref 39.0–52.0)
Hemoglobin: 10.2 g/dL — ABNORMAL LOW (ref 13.0–17.0)
O2 Saturation: 97 %
Patient temperature: 99.3
Potassium: 3.8 mmol/L (ref 3.5–5.1)
Sodium: 141 mmol/L (ref 135–145)
TCO2: 31 mmol/L (ref 22–32)
pCO2 arterial: 48.3 mmHg — ABNORMAL HIGH (ref 32.0–48.0)
pH, Arterial: 7.39 (ref 7.350–7.450)
pO2, Arterial: 94 mmHg (ref 83.0–108.0)

## 2019-07-23 LAB — MAGNESIUM: Magnesium: 2.2 mg/dL (ref 1.7–2.4)

## 2019-07-23 LAB — TRIGLYCERIDES: Triglycerides: 61 mg/dL (ref <150)

## 2019-07-23 MED ORDER — BETHANECHOL CHLORIDE 10 MG PO TABS
25.0000 mg | ORAL_TABLET | Freq: Three times a day (TID) | ORAL | Status: DC
  Administered 2019-07-23 – 2019-08-09 (×52): 25 mg
  Filled 2019-07-23 (×52): qty 3

## 2019-07-23 MED ORDER — ALBUMIN HUMAN 5 % IV SOLN
25.0000 g | Freq: Once | INTRAVENOUS | Status: AC
  Administered 2019-07-23: 25 g via INTRAVENOUS
  Filled 2019-07-23: qty 500

## 2019-07-23 MED ORDER — SODIUM PHOSPHATES 45 MMOLE/15ML IV SOLN
10.0000 mmol | Freq: Once | INTRAVENOUS | Status: AC
  Administered 2019-07-23: 10 mmol via INTRAVENOUS
  Filled 2019-07-23: qty 3.33

## 2019-07-23 NOTE — Progress Notes (Signed)
RT obtained ABG with the following results. No changes at this time. RT will continue to monitor.   Results for Stephen Juarez, Stephen Juarez (MRN [id number]) as of 07/23/2019 05:36  Ref. Range 07/23/2019 05:19  Sample type Unknown ARTERIAL  pH, Arterial Latest Ref Range: 7.350 - 7.450  7.390  pCO2 arterial Latest Ref Range: 32.0 - 48.0 mmHg 48.3 (H)  pO2, Arterial Latest Ref Range: 83.0 - 108.0 mmHg 94.0  TCO2 Latest Ref Range: 22 - 32 mmol/L 31  Acid-Base Excess Latest Ref Range: 0.0 - 2.0 mmol/L 4.0 (H)  Bicarbonate Latest Ref Range: 20.0 - 28.0 mmol/L 29.1 (H)  O2 Saturation Latest Units: % 97.0  Patient temperature Unknown 99.3 F  Collection site Unknown BRACHIAL ARTERY

## 2019-07-23 NOTE — Progress Notes (Addendum)
RT called to pts room for desaturation down to 88. RT suctioned pt, suction lavaged and performed recruitment maneuvers on pt before pts sats would come up to low 90's. Pt BS on right coarse at this time.Pts FIO2 was increased to 50%. Pt maintaining sats at 92% at this time. RT will continue to monitor.

## 2019-07-23 NOTE — Progress Notes (Signed)
VASCULAR LAB PRELIMINARY  PRELIMINARY  PRELIMINARY  PRELIMINARY  Carotid duplex completed.    Preliminary report:  See CV proc for preliminary results.   Lashaun Krapf, RVT 07/23/2019, 10:55 AM
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
## 2019-07-24 ENCOUNTER — Inpatient Hospital Stay (HOSPITAL_COMMUNITY): Payer: Medicaid Other

## 2019-07-24 LAB — BASIC METABOLIC PANEL
Anion gap: 7 (ref 5–15)
BUN: 21 mg/dL (ref 8–23)
CO2: 26 mmol/L (ref 22–32)
Calcium: 8.5 mg/dL — ABNORMAL LOW (ref 8.9–10.3)
Chloride: 109 mmol/L (ref 98–111)
Creatinine, Ser: 0.76 mg/dL (ref 0.61–1.24)
GFR calc Af Amer: >60 mL/min (ref >60)
GFR calc non Af Amer: >60 mL/min (ref >60)
Glucose, Bld: 160 mg/dL — ABNORMAL HIGH (ref 70–99)
Potassium: 4.2 mmol/L (ref 3.5–5.1)
Sodium: 142 mmol/L (ref 135–145)

## 2019-07-24 LAB — PHOSPHORUS: Phosphorus: 2.5 mg/dL (ref 2.5–4.6)

## 2019-07-24 LAB — GLUCOSE, CAPILLARY
Glucose-Capillary: 108 mg/dL — ABNORMAL HIGH (ref 70–99)
Glucose-Capillary: 140 mg/dL — ABNORMAL HIGH (ref 70–99)
Glucose-Capillary: 148 mg/dL — ABNORMAL HIGH (ref 70–99)
Glucose-Capillary: 120 mg/dL — ABNORMAL HIGH (ref 70–99)
Glucose-Capillary: 153 mg/dL — ABNORMAL HIGH (ref 70–99)
Glucose-Capillary: 148 mg/dL — ABNORMAL HIGH (ref 70–99)

## 2019-07-24 LAB — CBC
HCT: 29.8 % — ABNORMAL LOW (ref 39.0–52.0)
Hemoglobin: 9.4 g/dL — ABNORMAL LOW (ref 13.0–17.0)
MCH: 28.6 pg (ref 26.0–34.0)
MCHC: 31.5 g/dL (ref 30.0–36.0)
MCV: 90.6 fL (ref 80.0–100.0)
Platelets: 173 10*3/uL (ref 150–400)
RBC: 3.29 MIL/uL — ABNORMAL LOW (ref 4.22–5.81)
RDW: 13.1 % (ref 11.5–15.5)
WBC: 10.4 10*3/uL (ref 4.0–10.5)
nRBC: 0 % (ref 0.0–0.2)

## 2019-07-24 LAB — TRIGLYCERIDES: Triglycerides: 86 mg/dL (ref <150)

## 2019-07-24 LAB — MAGNESIUM: Magnesium: 2.2 mg/dL (ref 1.7–2.4)

## 2019-07-24 MED ORDER — QUETIAPINE FUMARATE 25 MG PO TABS
50.0000 mg | ORAL_TABLET | Freq: Every day | ORAL | Status: DC
  Administered 2019-07-24 – 2019-07-25 (×2): 50 mg
  Filled 2019-07-24 (×2): qty 2

## 2019-07-24 MED ORDER — ALBUMIN HUMAN 5 % IV SOLN
12.5000 g | Freq: Once | INTRAVENOUS | Status: AC
  Administered 2019-07-24: 12.5 g via INTRAVENOUS
  Filled 2019-07-24: qty 250

## 2019-07-24 MED ORDER — BISACODYL 10 MG RE SUPP
10.0000 mg | Freq: Once | RECTAL | Status: AC
  Administered 2019-07-24: 10 mg via RECTAL
  Filled 2019-07-24: qty 1

## 2019-07-24 MED ORDER — CLONAZEPAM 0.5 MG PO TABS
0.5000 mg | ORAL_TABLET | Freq: Two times a day (BID) | ORAL | Status: DC
  Administered 2019-07-24 – 2019-07-25 (×3): 0.5 mg
  Filled 2019-07-24 (×3): qty 1

## 2019-07-24 MED ORDER — POLYETHYLENE GLYCOL 3350 17 G PO PACK
17.0000 g | PACK | Freq: Two times a day (BID) | ORAL | Status: DC
  Administered 2019-07-24 – 2019-08-05 (×10): 17 g
  Filled 2019-07-24 (×13): qty 1

## 2019-07-24 NOTE — Progress Notes (Signed)
Assisted tele visit to patient with daughter.  Tabius Rood M, RN  
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
## 2019-07-25 ENCOUNTER — Inpatient Hospital Stay (HOSPITAL_COMMUNITY): Payer: Medicaid Other

## 2019-07-25 ENCOUNTER — Encounter (HOSPITAL_COMMUNITY): Admission: EM | Disposition: E | Payer: Self-pay | Source: Home / Self Care

## 2019-07-25 HISTORY — PX: PEG PLACEMENT: SHX5437

## 2019-07-25 HISTORY — PX: ESOPHAGOGASTRODUODENOSCOPY: SHX5428

## 2019-07-25 LAB — CBC
HCT: 31.5 % — ABNORMAL LOW (ref 39.0–52.0)
Hemoglobin: 10.1 g/dL — ABNORMAL LOW (ref 13.0–17.0)
MCH: 29 pg (ref 26.0–34.0)
MCHC: 32.1 g/dL (ref 30.0–36.0)
MCV: 90.5 fL (ref 80.0–100.0)
Platelets: 232 10*3/uL (ref 150–400)
RBC: 3.48 MIL/uL — ABNORMAL LOW (ref 4.22–5.81)
RDW: 13 % (ref 11.5–15.5)
WBC: 11 10*3/uL — ABNORMAL HIGH (ref 4.0–10.5)
nRBC: 0 % (ref 0.0–0.2)

## 2019-07-25 LAB — BASIC METABOLIC PANEL
Anion gap: 9 (ref 5–15)
BUN: 22 mg/dL (ref 8–23)
CO2: 26 mmol/L (ref 22–32)
Calcium: 8.8 mg/dL — ABNORMAL LOW (ref 8.9–10.3)
Chloride: 108 mmol/L (ref 98–111)
Creatinine, Ser: 0.66 mg/dL (ref 0.61–1.24)
GFR calc Af Amer: >60 mL/min (ref >60)
GFR calc non Af Amer: >60 mL/min (ref >60)
Glucose, Bld: 138 mg/dL — ABNORMAL HIGH (ref 70–99)
Potassium: 3.8 mmol/L (ref 3.5–5.1)
Sodium: 143 mmol/L (ref 135–145)

## 2019-07-25 LAB — GLUCOSE, CAPILLARY
Glucose-Capillary: 106 mg/dL — ABNORMAL HIGH (ref 70–99)
Glucose-Capillary: 126 mg/dL — ABNORMAL HIGH (ref 70–99)
Glucose-Capillary: 123 mg/dL — ABNORMAL HIGH (ref 70–99)
Glucose-Capillary: 130 mg/dL — ABNORMAL HIGH (ref 70–99)
Glucose-Capillary: 137 mg/dL — ABNORMAL HIGH (ref 70–99)
Glucose-Capillary: 139 mg/dL — ABNORMAL HIGH (ref 70–99)

## 2019-07-25 LAB — TRIGLYCERIDES: Triglycerides: 79 mg/dL (ref <150)

## 2019-07-25 LAB — PHOSPHORUS: Phosphorus: 2.3 mg/dL — ABNORMAL LOW (ref 2.5–4.6)

## 2019-07-25 LAB — MAGNESIUM: Magnesium: 2.1 mg/dL (ref 1.7–2.4)

## 2019-07-25 SURGERY — INSERTION, PEG TUBE
Anesthesia: Moderate Sedation

## 2019-07-25 MED ORDER — MIDAZOLAM HCL 2 MG/2ML IJ SOLN
INTRAMUSCULAR | Status: AC
  Filled 2019-07-25: qty 4

## 2019-07-25 MED ORDER — POTASSIUM PHOSPHATES 15 MMOLE/5ML IV SOLN
20.0000 mmol | Freq: Once | INTRAVENOUS | Status: AC
  Administered 2019-07-25: 20 mmol via INTRAVENOUS
  Filled 2019-07-25: qty 6.67

## 2019-07-25 MED ORDER — PRO-STAT SUGAR FREE PO LIQD
30.0000 mL | Freq: Every day | ORAL | Status: DC
  Administered 2019-07-26 – 2019-08-08 (×13): 30 mL
  Filled 2019-07-25 (×13): qty 30

## 2019-07-25 MED ORDER — MIDAZOLAM HCL 2 MG/2ML IJ SOLN
10.0000 mg | Freq: Once | INTRAMUSCULAR | Status: AC
  Administered 2019-07-25: 10 mg via INTRAVENOUS
  Filled 2019-07-25: qty 10

## 2019-07-25 MED ORDER — SODIUM CHLORIDE 0.9 % IV SOLN
INTRAVENOUS | Status: DC | PRN
  Administered 2019-07-25: 1000 mL via INTRAVENOUS
  Administered 2019-07-27: 250 mL via INTRAVENOUS
  Administered 2019-07-29 – 2019-08-01 (×3): 500 mL via INTRAVENOUS

## 2019-07-25 MED ORDER — ROCURONIUM BROMIDE 50 MG/5ML IV SOLN
100.0000 mg | Freq: Once | INTRAVENOUS | Status: AC
  Administered 2019-07-25: 100 mg via INTRAVENOUS
  Filled 2019-07-25: qty 10

## 2019-07-25 MED ORDER — MIDAZOLAM HCL 2 MG/2ML IJ SOLN: 4.0000 mg | Freq: Once | INTRAMUSCULAR | Status: AC

## 2019-07-25 MED ORDER — MIDAZOLAM HCL 2 MG/2ML IJ SOLN
INTRAMUSCULAR | Status: AC
  Administered 2019-07-25: 4 mg via INTRAVENOUS
  Filled 2019-07-25: qty 4

## 2019-07-25 MED ORDER — FENTANYL CITRATE (PF) 100 MCG/2ML IJ SOLN
100.0000 ug | Freq: Once | INTRAMUSCULAR | Status: AC
  Administered 2019-07-25: 12:00:00 100 ug via INTRAVENOUS

## 2019-07-25 MED ORDER — PIVOT 1.5 CAL PO LIQD
1000.0000 mL | ORAL | Status: DC
  Administered 2019-07-25 – 2019-08-08 (×11): 1000 mL
  Filled 2019-07-25: qty 1000

## 2019-07-25 MED ORDER — MIDAZOLAM HCL 2 MG/2ML IJ SOLN
4.0000 mg | Freq: Once | INTRAMUSCULAR | Status: AC
  Administered 2019-07-25: 4 mg via INTRAVENOUS

## 2019-07-25 NOTE — Progress Notes (Signed)
Assisted tele visit to patient with family member.  Aoki Wedemeyer McEachran, RN  
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
## 2019-07-25 NOTE — Progress Notes (Signed)
Assisted tele visit to patient with family member.  Jatin Naumann R, RN  

## 2019-07-25 NOTE — Social Work (Signed)
CSW unable to complete sbirt due to pt being on vent. CSW may attempt again at more appropriate time.   Jimmy Picket, Theresia Majors, [place] Clinical Social Worker [phone]
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
## 2019-07-25 NOTE — Progress Notes (Signed)
Assisted tele visit to patient with family member.  Nickolas Chalfin R, RN  

## 2019-07-26 ENCOUNTER — Inpatient Hospital Stay (HOSPITAL_COMMUNITY): Payer: Medicaid Other

## 2019-07-26 LAB — BASIC METABOLIC PANEL
Anion gap: 8 (ref 5–15)
BUN: 20 mg/dL (ref 8–23)
CO2: 24 mmol/L (ref 22–32)
Calcium: 8.4 mg/dL — ABNORMAL LOW (ref 8.9–10.3)
Chloride: 108 mmol/L (ref 98–111)
Creatinine, Ser: 0.67 mg/dL (ref 0.61–1.24)
GFR calc Af Amer: >60 mL/min (ref >60)
GFR calc non Af Amer: >60 mL/min (ref >60)
Glucose, Bld: 141 mg/dL — ABNORMAL HIGH (ref 70–99)
Potassium: 4.2 mmol/L (ref 3.5–5.1)
Sodium: 140 mmol/L (ref 135–145)

## 2019-07-26 LAB — URINALYSIS, ROUTINE W REFLEX MICROSCOPIC
Bilirubin Urine: NEGATIVE
Glucose, UA: NEGATIVE mg/dL
Ketones, ur: NEGATIVE mg/dL
Nitrite: NEGATIVE
Protein, ur: 30 mg/dL — AB
Specific Gravity, Urine: 1.027 (ref 1.005–1.030)
WBC, UA: >50 WBC/hpf — ABNORMAL HIGH (ref 0–5)
pH: 7 (ref 5.0–8.0)

## 2019-07-26 LAB — GLUCOSE, CAPILLARY
Glucose-Capillary: 145 mg/dL — ABNORMAL HIGH (ref 70–99)
Glucose-Capillary: 133 mg/dL — ABNORMAL HIGH (ref 70–99)
Glucose-Capillary: 139 mg/dL — ABNORMAL HIGH (ref 70–99)
Glucose-Capillary: 188 mg/dL — ABNORMAL HIGH (ref 70–99)
Glucose-Capillary: 132 mg/dL — ABNORMAL HIGH (ref 70–99)
Glucose-Capillary: 145 mg/dL — ABNORMAL HIGH (ref 70–99)

## 2019-07-26 LAB — PHOSPHORUS: Phosphorus: 2.3 mg/dL — ABNORMAL LOW (ref 2.5–4.6)

## 2019-07-26 LAB — TRIGLYCERIDES: Triglycerides: 59 mg/dL (ref <150)

## 2019-07-26 MED ORDER — QUETIAPINE FUMARATE 100 MG PO TABS
100.0000 mg | ORAL_TABLET | Freq: Every day | ORAL | Status: DC
  Administered 2019-07-26: 100 mg
  Filled 2019-07-26: qty 1

## 2019-07-26 MED ORDER — LORAZEPAM 2 MG/ML IJ SOLN
INTRAMUSCULAR | Status: AC
  Filled 2019-07-26: qty 1

## 2019-07-26 MED ORDER — MORPHINE SULFATE (PF) 2 MG/ML IV SOLN
2.0000 mg | INTRAVENOUS | Status: DC | PRN
  Administered 2019-07-26 – 2019-07-27 (×5): 2 mg via INTRAVENOUS
  Administered 2019-07-27 – 2019-07-28 (×5): 4 mg via INTRAVENOUS
  Administered 2019-07-28 – 2019-07-29 (×4): 2 mg via INTRAVENOUS
  Filled 2019-07-26 (×4): qty 2
  Filled 2019-07-26: qty 1
  Filled 2019-07-26 (×3): qty 2
  Filled 2019-07-26 (×2): qty 1
  Filled 2019-07-26: qty 2
  Filled 2019-07-26: qty 1
  Filled 2019-07-26: qty 2
  Filled 2019-07-26: qty 1

## 2019-07-26 MED ORDER — FENTANYL CITRATE (PF) 100 MCG/2ML IJ SOLN
50.0000 ug | Freq: Once | INTRAMUSCULAR | Status: AC
  Administered 2019-07-26: 50 ug via INTRAVENOUS

## 2019-07-26 MED ORDER — FENTANYL 2500MCG IN NS 250ML (10MCG/ML) PREMIX INFUSION
50.0000 ug/h | INTRAVENOUS | Status: DC
  Administered 2019-07-26: 200 ug/h via INTRAVENOUS
  Administered 2019-07-26: 50 ug/h via INTRAVENOUS
  Administered 2019-07-27 – 2019-07-28 (×4): 200 ug/h via INTRAVENOUS
  Filled 2019-07-26 (×5): qty 250

## 2019-07-26 MED ORDER — METHOCARBAMOL 500 MG PO TABS
1000.0000 mg | ORAL_TABLET | Freq: Three times a day (TID) | ORAL | Status: DC
  Administered 2019-07-26 – 2019-08-10 (×45): 1000 mg
  Filled 2019-07-26 (×45): qty 2

## 2019-07-26 MED ORDER — CLONAZEPAM 0.5 MG PO TABS
0.5000 mg | ORAL_TABLET | Freq: Every day | ORAL | Status: DC
  Administered 2019-07-26 – 2019-08-04 (×10): 0.5 mg
  Filled 2019-07-26 (×10): qty 1

## 2019-07-26 MED ORDER — LORAZEPAM 2 MG/ML IJ SOLN
1.0000 mg | INTRAMUSCULAR | Status: DC | PRN
  Administered 2019-07-26 – 2019-07-27 (×2): 1 mg via INTRAVENOUS
  Filled 2019-07-26 (×2): qty 1

## 2019-07-26 MED ORDER — MIDAZOLAM HCL 2 MG/2ML IJ SOLN
2.0000 mg | INTRAMUSCULAR | Status: DC | PRN
  Administered 2019-07-27 – 2019-08-01 (×16): 2 mg via INTRAVENOUS
  Filled 2019-07-26 (×15): qty 2

## 2019-07-26 MED ORDER — LORAZEPAM 2 MG/ML IJ SOLN
1.0000 mg | Freq: Once | INTRAMUSCULAR | Status: AC
  Administered 2019-07-26: 1 mg via INTRAVENOUS

## 2019-07-26 MED ORDER — HALOPERIDOL LACTATE 5 MG/ML IJ SOLN
5.0000 mg | Freq: Four times a day (QID) | INTRAMUSCULAR | Status: AC | PRN
  Administered 2019-07-26: 14:00:00 5 mg via INTRAVENOUS
  Filled 2019-07-26 (×2): qty 1

## 2019-07-26 MED ORDER — MIDAZOLAM HCL 2 MG/2ML IJ SOLN
2.0000 mg | INTRAMUSCULAR | Status: DC | PRN
  Administered 2019-07-26 (×2): 2 mg via INTRAVENOUS
  Filled 2019-07-26 (×2): qty 2

## 2019-07-26 MED ORDER — SODIUM PHOSPHATES 45 MMOLE/15ML IV SOLN
20.0000 mmol | Freq: Once | INTRAVENOUS | Status: AC
  Administered 2019-07-26: 20 mmol via INTRAVENOUS
  Filled 2019-07-26: qty 6.67

## 2019-07-26 MED ORDER — LORAZEPAM 2 MG/ML IJ SOLN: 1.0000 mg | INTRAMUSCULAR | Status: DC

## 2019-07-26 MED ORDER — FENTANYL BOLUS VIA INFUSION
50.0000 ug | INTRAVENOUS | Status: DC | PRN
  Administered 2019-07-29 (×3): 50 ug via INTRAVENOUS
  Filled 2019-07-26: qty 50
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
## 2019-07-26 NOTE — Progress Notes (Signed)
Patient belongings brought up from ED including clothing, glasses and wallet which was noted to have no cash or credit cards present by myself and Idelle Leech. Belonigings placed in patient closet.

## 2019-07-26 NOTE — Progress Notes (Addendum)
Patient with increased work of breathing 55 times a min on TC so patient placed back on vent and started on Precedex gtt and given Morphine 2mg . Temp 102, tylenol given and BC/ sputum sent earlier today. Patient then went into Afib 130s and still with RR 50s. Dr notified and haldol and ativan given without much relief. Patient started back on fentanyl gtt at 50 mcg.
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
## 2019-07-27 ENCOUNTER — Inpatient Hospital Stay (HOSPITAL_COMMUNITY): Payer: Medicaid Other

## 2019-07-27 DIAGNOSIS — S069X9A Unspecified intracranial injury with loss of consciousness of unspecified duration, initial encounter: Secondary | ICD-10-CM

## 2019-07-27 DIAGNOSIS — R7881 Bacteremia: Secondary | ICD-10-CM

## 2019-07-27 DIAGNOSIS — B9561 Methicillin susceptible Staphylococcus aureus infection as the cause of diseases classified elsewhere: Secondary | ICD-10-CM

## 2019-07-27 DIAGNOSIS — S2241XA Multiple fractures of ribs, right side, initial encounter for closed fracture: Secondary | ICD-10-CM

## 2019-07-27 DIAGNOSIS — Z9911 Dependence on respirator [ventilator] status: Secondary | ICD-10-CM

## 2019-07-27 DIAGNOSIS — J9690 Respiratory failure, unspecified, unspecified whether with hypoxia or hypercapnia: Secondary | ICD-10-CM

## 2019-07-27 DIAGNOSIS — Z930 Tracheostomy status: Secondary | ICD-10-CM

## 2019-07-27 DIAGNOSIS — I609 Nontraumatic subarachnoid hemorrhage, unspecified: Secondary | ICD-10-CM

## 2019-07-27 DIAGNOSIS — Z8781 Personal history of (healed) traumatic fracture: Secondary | ICD-10-CM

## 2019-07-27 DIAGNOSIS — Z931 Gastrostomy status: Secondary | ICD-10-CM

## 2019-07-27 DIAGNOSIS — Z87820 Personal history of traumatic brain injury: Secondary | ICD-10-CM

## 2019-07-27 DIAGNOSIS — S069XAA Unspecified intracranial injury with loss of consciousness status unknown, initial encounter: Secondary | ICD-10-CM | POA: Diagnosis present

## 2019-07-27 DIAGNOSIS — S270XXA Traumatic pneumothorax, initial encounter: Secondary | ICD-10-CM | POA: Diagnosis present

## 2019-07-27 LAB — BLOOD CULTURE ID PANEL (REFLEXED)

## 2019-07-27 LAB — BASIC METABOLIC PANEL
Anion gap: 13 (ref 5–15)
BUN: 31 mg/dL — ABNORMAL HIGH (ref 8–23)
CO2: 21 mmol/L — ABNORMAL LOW (ref 22–32)
Calcium: 8.4 mg/dL — ABNORMAL LOW (ref 8.9–10.3)
Chloride: 110 mmol/L (ref 98–111)
Creatinine, Ser: 0.87 mg/dL (ref 0.61–1.24)
GFR calc Af Amer: >60 mL/min (ref >60)
GFR calc non Af Amer: >60 mL/min (ref >60)
Glucose, Bld: 133 mg/dL — ABNORMAL HIGH (ref 70–99)
Potassium: 4.4 mmol/L (ref 3.5–5.1)
Sodium: 144 mmol/L (ref 135–145)

## 2019-07-27 LAB — ECHOCARDIOGRAM LIMITED
Height: 66 in
Weight: 2433.88 oz

## 2019-07-27 LAB — CBC
HCT: 34.3 % — ABNORMAL LOW (ref 39.0–52.0)
Hemoglobin: 11.1 g/dL — ABNORMAL LOW (ref 13.0–17.0)
MCH: 28.5 pg (ref 26.0–34.0)
MCHC: 32.4 g/dL (ref 30.0–36.0)
MCV: 88.2 fL (ref 80.0–100.0)
Platelets: 309 10*3/uL (ref 150–400)
RBC: 3.89 MIL/uL — ABNORMAL LOW (ref 4.22–5.81)
RDW: 13.2 % (ref 11.5–15.5)
WBC: 24.2 10*3/uL — ABNORMAL HIGH (ref 4.0–10.5)
nRBC: 0 % (ref 0.0–0.2)

## 2019-07-27 LAB — GLUCOSE, CAPILLARY
Glucose-Capillary: 112 mg/dL — ABNORMAL HIGH (ref 70–99)
Glucose-Capillary: 114 mg/dL — ABNORMAL HIGH (ref 70–99)
Glucose-Capillary: 185 mg/dL — ABNORMAL HIGH (ref 70–99)
Glucose-Capillary: 163 mg/dL — ABNORMAL HIGH (ref 70–99)
Glucose-Capillary: 220 mg/dL — ABNORMAL HIGH (ref 70–99)

## 2019-07-27 LAB — TRIGLYCERIDES: Triglycerides: 65 mg/dL (ref <150)

## 2019-07-27 LAB — PHOSPHORUS: Phosphorus: 2.7 mg/dL (ref 2.5–4.6)

## 2019-07-27 MED ORDER — CEFAZOLIN SODIUM-DEXTROSE 2-4 GM/100ML-% IV SOLN
2.0000 g | Freq: Three times a day (TID) | INTRAVENOUS | Status: DC
  Administered 2019-07-27: 14:00:00 2 g via INTRAVENOUS
  Filled 2019-07-27: qty 100

## 2019-07-27 MED ORDER — NOREPINEPHRINE 16 MG/250ML-% IV SOLN
0.0000 ug/min | INTRAVENOUS | Status: DC
  Administered 2019-07-27: 27 ug/min via INTRAVENOUS
  Administered 2019-07-28: 25 ug/min via INTRAVENOUS
  Filled 2019-07-27 (×2): qty 250

## 2019-07-27 MED ORDER — SODIUM PHOSPHATES 45 MMOLE/15ML IV SOLN
10.0000 mmol | Freq: Once | INTRAVENOUS | Status: AC
  Administered 2019-07-27: 09:00:00 10 mmol via INTRAVENOUS
  Filled 2019-07-27: qty 3.33

## 2019-07-27 MED ORDER — SODIUM CHLORIDE 0.9% FLUSH
10.0000 mL | INTRAVENOUS | Status: DC | PRN
  Administered 2019-07-29: 12:00:00 20 mL

## 2019-07-27 MED ORDER — NOREPINEPHRINE 4 MG/250ML-% IV SOLN: 0.0000 ug/min | INTRAVENOUS | Status: DC

## 2019-07-27 MED ORDER — PROPOFOL 1000 MG/100ML IV EMUL
5.0000 ug/kg/min | INTRAVENOUS | Status: DC
  Filled 2019-07-27: qty 100

## 2019-07-27 MED ORDER — SODIUM CHLORIDE 0.9% FLUSH
10.0000 mL | Freq: Two times a day (BID) | INTRAVENOUS | Status: DC
  Administered 2019-07-27: 12:00:00 10 mL
  Administered 2019-07-28: 20 mL
  Administered 2019-07-28 – 2019-08-04 (×8): 10 mL

## 2019-07-27 MED ORDER — SODIUM CHLORIDE 0.9 % IV SOLN: 250.0000 mL | INTRAVENOUS | Status: DC

## 2019-07-27 MED ORDER — LEVETIRACETAM 100 MG/ML PO SOLN
500.0000 mg | Freq: Two times a day (BID) | ORAL | Status: AC
  Administered 2019-07-27 – 2019-07-28 (×4): 500 mg
  Filled 2019-07-27 (×4): qty 5

## 2019-07-27 MED ORDER — VASOPRESSIN 20 UNIT/ML IV SOLN
0.0300 [IU]/min | INTRAVENOUS | Status: DC
  Administered 2019-07-27: 0.03 [IU]/min via INTRAVENOUS
  Filled 2019-07-27 (×2): qty 2

## 2019-07-27 MED ORDER — SODIUM CHLORIDE 0.9 % IV SOLN
2.0000 g | Freq: Three times a day (TID) | INTRAVENOUS | Status: DC
  Administered 2019-07-27: 2 g via INTRAVENOUS
  Filled 2019-07-27 (×3): qty 2

## 2019-07-27 MED ORDER — LACTATED RINGERS IV BOLUS
1000.0000 mL | Freq: Once | INTRAVENOUS | Status: AC
  Administered 2019-07-27: 10:00:00 1000 mL via INTRAVENOUS

## 2019-07-27 MED ORDER — NOREPINEPHRINE 4 MG/250ML-% IV SOLN
INTRAVENOUS | Status: AC
  Filled 2019-07-27: qty 250

## 2019-07-27 MED ORDER — SODIUM CHLORIDE 0.9 % IV SOLN: INTRAVENOUS | Status: DC | PRN

## 2019-07-27 MED ORDER — NOREPINEPHRINE 4 MG/250ML-% IV SOLN
0.0000 ug/min | INTRAVENOUS | Status: DC
  Administered 2019-07-27: 30 ug/min via INTRAVENOUS
  Administered 2019-07-27: 2 ug/min via INTRAVENOUS
  Filled 2019-07-27: qty 250

## 2019-07-27 MED ORDER — QUETIAPINE FUMARATE 100 MG PO TABS
100.0000 mg | ORAL_TABLET | Freq: Two times a day (BID) | ORAL | Status: DC
  Administered 2019-07-27 – 2019-07-31 (×10): 100 mg
  Filled 2019-07-27 (×10): qty 1

## 2019-07-27 MED ORDER — LACTATED RINGERS IV BOLUS
1000.0000 mL | Freq: Once | INTRAVENOUS | Status: AC
  Administered 2019-07-27: 1000 mL via INTRAVENOUS

## 2019-07-27 MED ORDER — MIDAZOLAM 50MG/50ML (1MG/ML) PREMIX INFUSION
1.0000 mg/h | INTRAVENOUS | Status: DC
  Administered 2019-07-27: 5 mg/h via INTRAVENOUS
  Administered 2019-07-27: 1 mg/h via INTRAVENOUS
  Administered 2019-07-28 (×2): 5 mg/h via INTRAVENOUS
  Filled 2019-07-27 (×4): qty 50

## 2019-07-27 MED ORDER — SODIUM CHLORIDE 0.9 % IV SOLN
2.0000 g | Freq: Three times a day (TID) | INTRAVENOUS | Status: DC
  Administered 2019-07-27 – 2019-08-08 (×35): 2 g via INTRAVENOUS
  Filled 2019-07-27 (×39): qty 2
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
## 2019-07-27 NOTE — Progress Notes (Signed)
Inpatient Rehab Admissions Coordinator Note:   Per PT recommendations, pt was screened for CIR candidacy by Estill Dooms, PT, DPT.  At this time we are recommending inpatient rehab consult.  I will place an order per our protocol.  Please contact me with questions.   Estill Dooms, PT, DPT 07/27/19 12:39 PM

## 2019-07-27 NOTE — Progress Notes (Signed)
  Echocardiogram 2D Echocardiogram has been performed.  Stephen Juarez 07/27/2019, 3:27 PM

## 2019-07-27 NOTE — Op Note (Signed)
[facility] Patient Name: Stephen Juarez Procedure Date : 07/14/2019 MRN: [id number] Attending MD: Jesusita Oka ,  Date of Birth: 1955-09-27 CSN: [id number] Age: 64 Admit Type: Inpatient Procedure:                Upper GI endoscopy Indications:              Dysphagia, Place PEG because patient is unable to                            eat, Place PEG due to dysphagia, Place PEG due to                            neurological disorder causing impaired swallowing Providers:                Larena Glassman, Technician Referring MD:              Medicines:                Fentanyl 300 micrograms IV, Midazolam 14 mg IV Complications:            No immediate complications. Estimated blood loss:                            Minimal. Estimated Blood Loss:     Estimated blood loss was minimal. Procedure:                After obtaining informed consent, the endoscope was                            passed under direct vision. Throughout the                            procedure, the patient's blood pressure, pulse, and                            oxygen saturations were monitored continuously. The                            [id number]-H190 ([id number]) Olympus gastroscope was                            introduced through the mouth, and advanced to the                            third part of duodenum. The upper GI endoscopy was                            accomplished without difficulty. The patient                            tolerated the procedure well. Scope In: Scope Out: Findings:      atrophic mucosa Impression:               - Normal [Site].                           -  No specimens collected.                           - Normal first portion of the duodenum. Recommendation:           - May administer water and medications now and may                            start tube feeds four hours post-procedure. Procedure Code(s):        --- Professional ---    [phone], Esophagogastroduodenoscopy, flexible,                            transoral; diagnostic, including collection of                            specimen(s) by brushing or washing, when performed                            (separate procedure) Diagnosis Code(s):        --- Professional ---                           R13.10, Dysphagia, unspecified CPT copyright 2019 American Medical Association. All rights reserved. The codes documented in this report are preliminary and upon coder review may  be revised to meet current compliance requirements. Diamantina Monks,  07/27/2019 1:36:20 PM Number of Addenda: 0

## 2019-07-27 NOTE — Procedures (Signed)
   Procedure Note  Date: 07/27/2019  Procedure: central venous catheter placement--right, subclavian vein, without ultrasound guidance  Pre-op diagnosis: hypotension, need for administration of vasopressors Post-op diagnosis: same  Surgeon: Diamantina Monks, MD  Anesthesia: local  EBL: <5cc Drains/Implants: 16 cm, triple lumen central venous catheter  Description of procedure: Time-out was performed verifying correct patient, procedure, site, laterality, and signature of informed consent. The right upper chest was prepped and draped in the usual sterile fashion. The subclavian vein was accessed using anatomic landmarks with an introducer needle and a guidewire passed through the needle. The needle was removed and a skin nick was made. The tract was dilated and the central venous catheter advanced over the guidewire followed by removal of the guidewire. All ports drew blood easily and all were flushed with saline. The catheter was secured to the skin with suture and a sterile dressing. The patient tolerated the procedure well. There were no immediate complications. Follow up chest x-ray was ordered to confirm positioning and the absence of a pneumothorax.   Diamantina Monks, MD General and Trauma Surgery [facility]
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
## 2019-07-27 NOTE — Procedures (Signed)
Arterial Catheter Insertion Procedure Note Tilden Broz [id number] Sep 26, 1955  Procedure: Insertion of Arterial Catheter  Indications: Blood pressure monitoring  Procedure Details Consent: Risks of procedure as well as the alternatives and risks of each were explained to the (patient/caregiver).  Consent for procedure obtained. Time Out: Verified patient identification, verified procedure, site/side was marked, verified correct patient position, special equipment/implants available, medications/allergies/relevent history reviewed, required imaging and test results available.  Performed  Maximum sterile technique was used including antiseptics, cap, gloves, gown, hand hygiene, mask and sheet. Skin prep: Chlorhexidine; local anesthetic administered 22 gauge catheter was inserted into right radial artery using the Seldinger technique. ULTRASOUND GUIDANCE USED: NO Evaluation Blood flow good; BP tracing good. Complications: No apparent complications.   Melanee Spry 07/27/2019
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
## 2019-07-28 DIAGNOSIS — D72829 Elevated white blood cell count, unspecified: Secondary | ICD-10-CM

## 2019-07-28 DIAGNOSIS — J156 Pneumonia due to other aerobic Gram-negative bacteria: Secondary | ICD-10-CM

## 2019-07-28 DIAGNOSIS — R509 Fever, unspecified: Secondary | ICD-10-CM

## 2019-07-28 LAB — GLUCOSE, CAPILLARY
Glucose-Capillary: 200 mg/dL — ABNORMAL HIGH (ref 70–99)
Glucose-Capillary: 195 mg/dL — ABNORMAL HIGH (ref 70–99)
Glucose-Capillary: 170 mg/dL — ABNORMAL HIGH (ref 70–99)
Glucose-Capillary: 148 mg/dL — ABNORMAL HIGH (ref 70–99)
Glucose-Capillary: 146 mg/dL — ABNORMAL HIGH (ref 70–99)
Glucose-Capillary: 155 mg/dL — ABNORMAL HIGH (ref 70–99)

## 2019-07-28 LAB — BASIC METABOLIC PANEL
Anion gap: 6 (ref 5–15)
BUN: 36 mg/dL — ABNORMAL HIGH (ref 8–23)
CO2: 25 mmol/L (ref 22–32)
Calcium: 8.3 mg/dL — ABNORMAL LOW (ref 8.9–10.3)
Chloride: 114 mmol/L — ABNORMAL HIGH (ref 98–111)
Creatinine, Ser: 0.76 mg/dL (ref 0.61–1.24)
GFR calc Af Amer: >60 mL/min (ref >60)
GFR calc non Af Amer: >60 mL/min (ref >60)
Glucose, Bld: 216 mg/dL — ABNORMAL HIGH (ref 70–99)
Potassium: 4.7 mmol/L (ref 3.5–5.1)
Sodium: 145 mmol/L (ref 135–145)

## 2019-07-28 LAB — CBC
HCT: 31.1 % — ABNORMAL LOW (ref 39.0–52.0)
Hemoglobin: 9.7 g/dL — ABNORMAL LOW (ref 13.0–17.0)
MCH: 28.4 pg (ref 26.0–34.0)
MCHC: 31.2 g/dL (ref 30.0–36.0)
MCV: 90.9 fL (ref 80.0–100.0)
Platelets: 314 10*3/uL (ref 150–400)
RBC: 3.42 MIL/uL — ABNORMAL LOW (ref 4.22–5.81)
RDW: 13.4 % (ref 11.5–15.5)
WBC: 24.7 10*3/uL — ABNORMAL HIGH (ref 4.0–10.5)
nRBC: 0 % (ref 0.0–0.2)

## 2019-07-28 LAB — PHOSPHORUS
Phosphorus: 1.8 mg/dL — ABNORMAL LOW (ref 2.5–4.6)
Phosphorus: 1.8 mg/dL — ABNORMAL LOW (ref 2.5–4.6)

## 2019-07-28 LAB — MAGNESIUM: Magnesium: 2.4 mg/dL (ref 1.7–2.4)

## 2019-07-28 MED ORDER — WHITE PETROLATUM EX OINT
TOPICAL_OINTMENT | CUTANEOUS | Status: AC
  Filled 2019-07-28: qty 28.35

## 2019-07-28 MED ORDER — SODIUM PHOSPHATES 45 MMOLE/15ML IV SOLN
30.0000 mmol | Freq: Once | INTRAVENOUS | Status: AC
  Administered 2019-07-28: 30 mmol via INTRAVENOUS
  Filled 2019-07-28: qty 10
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
## 2019-07-28 NOTE — Progress Notes (Signed)
SLP Cancellation Note  Patient Details Name: Stephen Juarez MRN: [id number] DOB: 1955-04-08   Cancelled treatment:       Reason Eval/Treat Not Completed: Medical issues which prohibited therapy. Pt on the vent, not responsive. RN recommends holding PMV tx. Will f/u next week for readiness to resume PMV trials.     Mahala Menghini., M.A. CCC-SLP Acute Rehabilitation Services Pager [phone] Office [phone]  07/28/2019, 8:29 AM

## 2019-07-28 NOTE — Progress Notes (Signed)
OT Cancellation Note  Patient Details Name: Carron Jaggi MRN: [id number] DOB: [birth year]   Cancelled Treatment:    Reason Eval/Treat Not Completed: Patient not medically ready. Medical decline. Re-intubated. Poor arousal per RN. RN request hold and OT will return as schedule allows. Thank you.   Larita Deremer M Dewel Lotter Nissan Frazzini MSOT, OTR/L Acute Rehab Pager: [phone] Office: [phone] 07/28/2019, 9:40 AM
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
## 2019-07-28 NOTE — Progress Notes (Signed)
PT Cancellation Note  Patient Details Name: Stephen Juarez MRN: [id number] DOB: May 27, 1955   Cancelled Treatment:    Reason Eval/Treat Not Completed: Medical issues which prohibited therapy.  See OT note (OT checked in with RN).    PT to follow acutely for more medical stability to proceed with treatment/safe mobility.  Thanks,  Corinna Capra, PT, DPT  Acute Rehabilitation [phone] pager #[phone] office       Lurena Joiner B Kymoni Lesperance 07/28/2019, 10:27 AM

## 2019-07-28 NOTE — Progress Notes (Signed)
    [facility] has been requested to perform a transesophageal echocardiogram on 07/15/2019 for Bacteremia. Patient in sedated and on vent. Discussed with the patient's daughter over the phone Stephen Juarez who is agreeable. Verbal consent witnessed by Judy Pimple PA.  After careful review of history and examination, the risks and benefits of transesophageal echocardiogram have been explained including risks of esophageal damage, perforation (1:10,000 risk), bleeding, pharyngeal hematoma as well as other potential complications associated with conscious sedation including aspiration, arrhythmia, respiratory failure and death. Alternatives to treatment were discussed, questions were answered.   Stephen Page, NP-C 07/28/2019 4:44 PM

## 2019-07-28 NOTE — Progress Notes (Signed)
[facility]   Reason for visit: Follow up on MSSA bacteremia  Interval History: repeat blood cultures sent and ngtd. Both sets of blood cultures (aerobic only drawn) positive with Staph aureus.  Respiratory culture with Serratia, sensitivities pending.  Fever curve was trending down but report of temperature of 103 now.  WBC remains up at 24.7.  Right subclavian placed yesterday Day 2 cefepime   Physical Exam: Constitutional:  Vitals:   07/28/19 1430 07/28/19 1514  BP: (!) 94/58   Pulse: (!) 132 (!) 123  Resp: (!) 25 (!) 27  Temp:    SpO2: 91% 90%   patient appears in NAD HENT: + trach Respiratory: respiratory effort on vent Cardiovascular: tachy RR GI: soft, nt, nd  Review of Systems: Unable to be assessed due to mental status  Lab Results  Component Value Date   WBC 24.7 (H) 07/28/2019   HGB 9.7 (L) 07/28/2019   HCT 31.1 (L) 07/28/2019   MCV 90.9 07/28/2019   PLT 314 07/28/2019    Lab Results  Component Value Date   CREATININE 0.76 07/28/2019   BUN 36 (H) 07/28/2019   NA 145 07/28/2019   K 4.7 07/28/2019   CL 114 (H) 07/28/2019   CO2 25 07/28/2019    Lab Results  Component Value Date   ALT 20 07/16/2019   AST 26 08/03/2019   ALKPHOS 78 07/27/2019     Microbiology: Recent Results (from the past 240 hour(s))  Respiratory Panel by RT PCR (Flu A&B, Covid) - Nasopharyngeal Swab     Status: None   Collection Time: 07/22/2019  9:47 PM   Specimen: Nasopharyngeal Swab  Result Value Ref Range Status   SARS Coronavirus 2 by RT PCR NEGATIVE NEGATIVE Final    Comment: (NOTE) SARS-CoV-2 target nucleic acids are NOT DETECTED. The SARS-CoV-2 RNA is generally detectable in upper respiratoy specimens during the acute phase of infection. The lowest concentration of SARS-CoV-2 viral copies this assay can detect is 131 copies/mL. A negative result does not preclude SARS-Cov-2 infection and should not be used as the sole basis for treatment  or other patient management decisions. A negative result may occur with  improper specimen collection/handling, submission of specimen other than nasopharyngeal swab, presence of viral mutation(s) within the areas targeted by this assay, and inadequate number of viral copies (<131 copies/mL). A negative result must be combined with clinical observations, patient history, and epidemiological information. The expected result is Negative. Fact Sheet for Patients:  https://www.moore.com/ Fact Sheet for Healthcare Providers:  https://www.young.biz/ This test is not yet ap proved or cleared by the [place] FDA and  has been authorized for detection and/or diagnosis of SARS-CoV-2 by FDA under an Emergency Use Authorization (EUA). This EUA will remain  in effect (meaning this test can be used) for the duration of the COVID-19 declaration under Section 564(b)(1) of the Act, 21 U.S.C. section 360bbb-3(b)(1), unless the authorization is terminated or revoked sooner.    Influenza A by PCR NEGATIVE NEGATIVE Final   Influenza B by PCR NEGATIVE NEGATIVE Final    Comment: (NOTE) The Xpert Xpress SARS-CoV-2/FLU/RSV assay is intended as an aid in  the diagnosis of influenza from Nasopharyngeal swab specimens and  should not be used as a sole basis for treatment. Nasal washings and  aspirates are unacceptable for Xpert Xpress SARS-CoV-2/FLU/RSV  testing. Fact Sheet for Patients: https://www.moore.com/ Fact Sheet for Healthcare Providers: https://www.young.biz/ This test is not yet approved or cleared by the [place]  States FDA and  has been authorized for detection and/or diagnosis of SARS-CoV-2 by  FDA under an Emergency Use Authorization (EUA). This EUA will remain  in effect (meaning this test can be used) for the duration of the  Covid-19 declaration under Section 564(b)(1) of the Act, 21  U.S.C. section  360bbb-3(b)(1), unless the authorization is  terminated or revoked. Performed at [facility], [address], [place], [place] [address]   MRSA PCR Screening     Status: None   Collection Time: 07/20/19  5:32 PM   Specimen: Nasal Mucosa; Nasopharyngeal  Result Value Ref Range Status   MRSA by PCR NEGATIVE NEGATIVE Final    Comment:        The GeneXpert MRSA Assay (FDA approved for NASAL specimens only), is one component of a comprehensive MRSA colonization surveillance program. It is not intended to diagnose MRSA infection nor to guide or monitor treatment for MRSA infections. Performed at [facility], [address] [place], [place] [address]   Culture, blood (routine x 2)     Status: Abnormal (Preliminary result)   Collection Time: 07/26/19 10:03 AM   Specimen: BLOOD LEFT ARM  Result Value Ref Range Status   Specimen Description BLOOD LEFT ARM  Final   Special Requests   Final    BOTTLES DRAWN AEROBIC ONLY Blood Culture results may not be optimal due to an inadequate volume of blood received in culture bottles   Culture  Setup Time   Final    AEROBIC BOTTLE ONLY GRAM POSITIVE COCCI IN CLUSTERS CRITICAL RESULT CALLED TO, READ BACK BY AND VERIFIED WITH: PHRMD J MILLEN @0640  07/27/19 BY S GEZAHEGN Performed at [facility], [address] [place], [place]    Culture STAPHYLOCOCCUS AUREUS (A)  Final   Report Status PENDING  Incomplete  Blood Culture ID Panel (Reflexed)     Status: Abnormal   Collection Time: 07/26/19 10:03 AM  Result Value Ref Range Status   Enterococcus species NOT DETECTED NOT DETECTED Final   Listeria monocytogenes NOT DETECTED NOT DETECTED Final   Staphylococcus species DETECTED (A) NOT DETECTED Final    Comment: CRITICAL RESULT CALLED TO, READ BACK BY AND VERIFIED WITH: PHRMD J MILLEN @0640  07/27/19 BY S GEZAHEGN    Staphylococcus aureus (BCID) DETECTED (A) NOT DETECTED Final    Comment: Methicillin (oxacillin)  susceptible Staphylococcus aureus (MSSA). Preferred therapy is anti staphylococcal beta lactam antibiotic (Cefazolin or Nafcillin), unless clinically contraindicated. CRITICAL RESULT CALLED TO, READ BACK BY AND VERIFIED WITH: PHRMD J MILLEN @0640  07/27/19 BY S GEZAHEGN    Methicillin resistance NOT DETECTED NOT DETECTED Final   Streptococcus species NOT DETECTED NOT DETECTED Final   Streptococcus agalactiae NOT DETECTED NOT DETECTED Final   Streptococcus pneumoniae NOT DETECTED NOT DETECTED Final   Streptococcus pyogenes NOT DETECTED NOT DETECTED Final   Acinetobacter baumannii NOT DETECTED NOT DETECTED Final   Enterobacteriaceae species NOT DETECTED NOT DETECTED Final   Enterobacter cloacae complex NOT DETECTED NOT DETECTED Final   Escherichia coli NOT DETECTED NOT DETECTED Final   Klebsiella oxytoca NOT DETECTED NOT DETECTED Final   Klebsiella pneumoniae NOT DETECTED NOT DETECTED Final   Proteus species NOT DETECTED NOT DETECTED Final   Serratia marcescens NOT DETECTED NOT DETECTED Final   Haemophilus influenzae NOT DETECTED NOT DETECTED Final   Neisseria meningitidis NOT DETECTED NOT DETECTED Final   Pseudomonas aeruginosa NOT DETECTED NOT DETECTED Final   Candida albicans NOT DETECTED NOT DETECTED  Final   Candida glabrata NOT DETECTED NOT DETECTED Final   Candida krusei NOT DETECTED NOT DETECTED Final   Candida parapsilosis NOT DETECTED NOT DETECTED Final   Candida tropicalis NOT DETECTED NOT DETECTED Final    Comment: Performed at [facility], [place] [address], [place], [place] [address]  Culture, respiratory (non-expectorated)     Status: None (Preliminary result)   Collection Time: 07/26/19 11:16 AM   Specimen: Tracheal Aspirate; Respiratory  Result Value Ref Range Status   Specimen Description TRACHEAL ASPIRATE  Final   Special Requests NONE  Final   Gram Stain   Final    RARE WBC PRESENT, PREDOMINANTLY PMN MODERATE GRAM POSITIVE RODS RARE GRAM NEGATIVE  COCCOBACILLI RARE GRAM POSITIVE COCCI IN CLUSTERS    Culture   Final    FEW SERRATIA MARCESCENS SUSCEPTIBILITIES TO FOLLOW CULTURE REINCUBATED FOR BETTER GROWTH Performed at [facility], [place] [address] [place], [place] [address]    Report Status PENDING  Incomplete  Culture, blood (routine x 2)     Status: Abnormal (Preliminary result)   Collection Time: 07/26/19  6:26 PM   Specimen: BLOOD LEFT HAND  Result Value Ref Range Status   Specimen Description BLOOD LEFT HAND  Final   Special Requests   Final    BOTTLES DRAWN AEROBIC ONLY Blood Culture results may not be optimal due to an inadequate volume of blood received in culture bottles   Culture  Setup Time   Final    AEROBIC BOTTLE ONLY GRAM POSITIVE COCCI IN CLUSTERS CRITICAL VALUE NOTED.  VALUE IS CONSISTENT WITH PREVIOUSLY REPORTED AND CALLED VALUE. Performed at [facility], [place] [address] [place], [place] [address]    Culture STAPHYLOCOCCUS AUREUS (A)  Final   Report Status PENDING  Incomplete  Culture, blood (routine x 2)     Status: None (Preliminary result)   Collection Time: 07/27/19  2:29 PM   Specimen: BLOOD LEFT HAND  Result Value Ref Range Status   Specimen Description BLOOD LEFT HAND  Final   Special Requests   Final    BOTTLES DRAWN AEROBIC AND ANAEROBIC Blood Culture adequate volume   Culture   Final    NO GROWTH < 24 HOURS Performed at [facility], [place] [address] [place], [place] [address]    Report Status PENDING  Incomplete  Culture, blood (routine x 2)     Status: None (Preliminary result)   Collection Time: 07/27/19  2:29 PM   Specimen: BLOOD LEFT ARM  Result Value Ref Range Status   Specimen Description BLOOD LEFT ARM  Final   Special Requests   Final    BOTTLES DRAWN AEROBIC AND ANAEROBIC Blood Culture adequate volume   Culture   Final    NO GROWTH < 24 HOURS Performed at [facility], [place] [address] [place], [place] [address]    Report Status PENDING  Incomplete     Impression/Plan:  1. MSSA bacteremia - TTE with no signs of vegetation.  Surgery has requested TEE.  Repeat blood cultures sent and ngtd.  Lungs without Staph so does not seem to be source.   Duration of IV treatment depends on TEE findings.  2.  Fever - febrile again this afternoon.  On cefepime for pneumonia and culture with Serratia.  Will continue with cefepime as best option for Serratia.    3.  Leukocytosis - remains up, stable.  Likely from #1 or pneumonia/both.    Discussed  with Dr. Bedelia Person

## 2019-07-28 NOTE — Progress Notes (Addendum)
Thank you for consult on Stephen Juarez--chart reviewed and patient briefly examined by Dr. Dalene Carrow. He was septic appearing and was minimally responsive. Note that he has had further medical decline and continues to require vent support. We will follow at a distance and recommend reconsult once extubated and tolerating therapy.
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
## 2019-07-29 ENCOUNTER — Inpatient Hospital Stay: Payer: Self-pay

## 2019-07-29 ENCOUNTER — Encounter (HOSPITAL_COMMUNITY): Payer: Self-pay

## 2019-07-29 ENCOUNTER — Encounter (HOSPITAL_COMMUNITY): Admission: EM | Disposition: E | Payer: Self-pay | Source: Home / Self Care

## 2019-07-29 ENCOUNTER — Inpatient Hospital Stay (HOSPITAL_COMMUNITY): Payer: Medicaid Other

## 2019-07-29 DIAGNOSIS — I339 Acute and subacute endocarditis, unspecified: Secondary | ICD-10-CM

## 2019-07-29 DIAGNOSIS — I313 Pericardial effusion (noninflammatory): Secondary | ICD-10-CM

## 2019-07-29 DIAGNOSIS — J1289 Other viral pneumonia: Secondary | ICD-10-CM

## 2019-07-29 DIAGNOSIS — I330 Acute and subacute infective endocarditis: Secondary | ICD-10-CM

## 2019-07-29 DIAGNOSIS — I340 Nonrheumatic mitral (valve) insufficiency: Secondary | ICD-10-CM

## 2019-07-29 DIAGNOSIS — R7881 Bacteremia: Secondary | ICD-10-CM

## 2019-07-29 DIAGNOSIS — Z1619 Resistance to other specified beta lactam antibiotics: Secondary | ICD-10-CM

## 2019-07-29 DIAGNOSIS — J90 Pleural effusion, not elsewhere classified: Secondary | ICD-10-CM

## 2019-07-29 DIAGNOSIS — I349 Nonrheumatic mitral valve disorder, unspecified: Secondary | ICD-10-CM

## 2019-07-29 HISTORY — PX: ECHO TEE (BEDSIDE): ECH11820

## 2019-07-29 LAB — CULTURE, RESPIRATORY W GRAM STAIN

## 2019-07-29 LAB — BASIC METABOLIC PANEL
Anion gap: 6 (ref 5–15)
Anion gap: 8 (ref 5–15)
BUN: 36 mg/dL — ABNORMAL HIGH (ref 8–23)
BUN: 33 mg/dL — ABNORMAL HIGH (ref 8–23)
CO2: 27 mmol/L (ref 22–32)
CO2: 28 mmol/L (ref 22–32)
Calcium: 8.4 mg/dL — ABNORMAL LOW (ref 8.9–10.3)
Calcium: 8.7 mg/dL — ABNORMAL LOW (ref 8.9–10.3)
Chloride: 116 mmol/L — ABNORMAL HIGH (ref 98–111)
Chloride: 115 mmol/L — ABNORMAL HIGH (ref 98–111)
Creatinine, Ser: 0.68 mg/dL (ref 0.61–1.24)
Creatinine, Ser: 0.69 mg/dL (ref 0.61–1.24)
GFR calc Af Amer: >60 mL/min (ref >60)
GFR calc Af Amer: >60 mL/min (ref >60)
GFR calc non Af Amer: >60 mL/min (ref >60)
GFR calc non Af Amer: >60 mL/min (ref >60)
Glucose, Bld: 129 mg/dL — ABNORMAL HIGH (ref 70–99)
Glucose, Bld: 182 mg/dL — ABNORMAL HIGH (ref 70–99)
Potassium: 4.8 mmol/L (ref 3.5–5.1)
Potassium: 4.3 mmol/L (ref 3.5–5.1)
Sodium: 149 mmol/L — ABNORMAL HIGH (ref 135–145)
Sodium: 151 mmol/L — ABNORMAL HIGH (ref 135–145)

## 2019-07-29 LAB — GLUCOSE, CAPILLARY
Glucose-Capillary: 121 mg/dL — ABNORMAL HIGH (ref 70–99)
Glucose-Capillary: 131 mg/dL — ABNORMAL HIGH (ref 70–99)
Glucose-Capillary: 142 mg/dL — ABNORMAL HIGH (ref 70–99)
Glucose-Capillary: 158 mg/dL — ABNORMAL HIGH (ref 70–99)
Glucose-Capillary: 147 mg/dL — ABNORMAL HIGH (ref 70–99)
Glucose-Capillary: 168 mg/dL — ABNORMAL HIGH (ref 70–99)

## 2019-07-29 LAB — CBC
HCT: 29.2 % — ABNORMAL LOW (ref 39.0–52.0)
Hemoglobin: 8.9 g/dL — ABNORMAL LOW (ref 13.0–17.0)
MCH: 28.3 pg (ref 26.0–34.0)
MCHC: 30.5 g/dL (ref 30.0–36.0)
MCV: 92.7 fL (ref 80.0–100.0)
Platelets: 311 10*3/uL (ref 150–400)
RBC: 3.15 MIL/uL — ABNORMAL LOW (ref 4.22–5.81)
RDW: 14.1 % (ref 11.5–15.5)
WBC: 19.3 10*3/uL — ABNORMAL HIGH (ref 4.0–10.5)
nRBC: 0 % (ref 0.0–0.2)

## 2019-07-29 LAB — CULTURE, BLOOD (ROUTINE X 2)

## 2019-07-29 LAB — MAGNESIUM
Magnesium: 2.7 mg/dL — ABNORMAL HIGH (ref 1.7–2.4)
Magnesium: 2.4 mg/dL (ref 1.7–2.4)

## 2019-07-29 LAB — PHOSPHORUS: Phosphorus: 2.4 mg/dL — ABNORMAL LOW (ref 2.5–4.6)

## 2019-07-29 SURGERY — ECHOCARDIOGRAM, TRANSESOPHAGEAL
Anesthesia: Monitor Anesthesia Care

## 2019-07-29 MED ORDER — FENTANYL CITRATE (PF) 100 MCG/2ML IJ SOLN
25.0000 ug | Freq: Once | INTRAMUSCULAR | Status: AC
  Administered 2019-07-29: 25 ug via INTRAVENOUS

## 2019-07-29 MED ORDER — MIDAZOLAM BOLUS VIA INFUSION
2.0000 mg | Freq: Once | INTRAVENOUS | Status: DC
  Administered 2019-07-29: 10:00:00 2 mg via INTRAVENOUS

## 2019-07-29 MED ORDER — SODIUM PHOSPHATES 45 MMOLE/15ML IV SOLN
20.0000 mmol | Freq: Once | INTRAVENOUS | Status: AC
  Administered 2019-07-29: 20 mmol via INTRAVENOUS
  Filled 2019-07-29: qty 6.67

## 2019-07-29 MED ORDER — FENTANYL BOLUS VIA INFUSION: 25.0000 ug | Freq: Once | INTRAVENOUS | Status: DC

## 2019-07-29 MED ORDER — FENTANYL BOLUS VIA INFUSION
25.0000 ug | Freq: Once | INTRAVENOUS | Status: DC
  Administered 2019-07-29: 10:00:00 25 ug via INTRAVENOUS

## 2019-07-29 MED ORDER — FENTANYL 2500MCG IN NS 250ML (10MCG/ML) PREMIX INFUSION
200.0000 ug/h | INTRAVENOUS | Status: DC
  Administered 2019-07-29: 100 ug/h via INTRAVENOUS
  Administered 2019-07-30 – 2019-08-01 (×6): 200 ug/h via INTRAVENOUS
  Administered 2019-08-02 – 2019-08-05 (×5): 125 ug/h via INTRAVENOUS
  Administered 2019-08-06: 120 ug/h via INTRAVENOUS
  Administered 2019-08-07 (×2): 100 ug/h via INTRAVENOUS
  Administered 2019-08-09 – 2019-08-10 (×2): 150 ug/h via INTRAVENOUS
  Administered 2019-08-10: 19:00:00 250 ug/h via INTRAVENOUS
  Filled 2019-07-29 (×15): qty 250
  Filled 2019-07-29: qty 500
  Filled 2019-07-29 (×2): qty 250

## 2019-07-29 MED ORDER — MIDAZOLAM HCL 2 MG/2ML IJ SOLN
2.0000 mg | Freq: Once | INTRAMUSCULAR | Status: DC
  Filled 2019-07-29: qty 2
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
## 2019-07-29 NOTE — Procedures (Signed)
    TRANSESOPHAGEAL ECHOCARDIOGRAM   NAME:  Stephen Juarez   MRN: [id number] DOB:  18-Jun-1955   ADMIT DATE: 14-Aug-2019  INDICATIONS: Bacteremia  PROCEDURE:   Informed consent was obtained prior to the procedure, please see note from 07/28/19.  I have reviewed the patient's history, imaging, labs personally. I have reviewed the H&P from this admission. His interval history and physical today agrees with Dr. Marvetta Gibbons note from today. He has a trach and is currently on the ventilator. He has PRN sedation orders.  Procedural time out performed. During this procedure the patient is administered a total of Versed 4 mg, fentanyl 50 mcg and morphine 4mg  to achieve and maintain moderate conscious sedation.  The patient's heart rate, blood pressure, and oxygen saturation are monitored continuously during the procedure. The period of conscious sedation is 28 minutes, of which I was present face-to-face 100% of this time.   The transesophageal probe was inserted in the esophagus without difficulty and multiple views were obtained. Transgastric views not obtained given recent placement of PEG tube. Tube feeds have been off for >6 hours.   COMPLICATIONS:    There were no immediate complications.  FINDINGS:  LEFT VENTRICLE: EF = 60-65%. No regional wall motion abnormalities.  RIGHT VENTRICLE: Normal size and function.   LEFT ATRIUM: No thrombus/mass.  LEFT ATRIAL APPENDAGE: No thrombus/mass.   RIGHT ATRIUM: No thrombus/mass.  AORTIC VALVE:  Trileaflet. No regurgitation. No vegetation.  MITRAL VALVE:    Normal structure. Trivial regurgitation. Thickening of both anterior and posterior leaflet, and small mobile mass seen are consistent with endocarditis.  TRICUSPID VALVE: Normal structure. Trivial regurgitation. No vegetation seen.  PULMONIC VALVE: Grossly normal structure. No regurgitation. No apparent vegetation.  INTERATRIAL SEPTUM: No PFO or ASD seen by color Doppler.  PERICARDIUM:  Small pericardial effusion seen.  DESCENDING AORTA: MIld diffuse plaque seen   CONCLUSION: Mitral valve bileaflet thickening and small mobile mass consistent with mitral valve endocarditis.   , MD, PhD [facility]  [address], [address] [place], [place] [phone]   9:44 AM

## 2019-07-29 NOTE — Progress Notes (Signed)
150 ml Fentanyl gtt and 10 ml versed gtt wasted in med room waste container, witnessed by Londell Moh, RN.

## 2019-07-29 NOTE — Progress Notes (Signed)
[facility]   Reason for visit: Follow up on bacteremia  Interval History: TEE positive for MV endocarditis.  Repeat blood culture from 4/21 with one set positive with GPC.  New set sent today.   Day 3 cefepime    Physical Exam: Constitutional: getting TEE Vitals:   07/11/2019 1300 07/28/2019 1315  BP: 131/79 126/76  Pulse: (!) 132 (!) 132  Resp: (!) 31 (!) 33  Temp:    SpO2: 100% 100%   patient appears in NAD HENT: + trach   Review of Systems: Unable to be assessed due to mental status  Lab Results  Component Value Date   WBC 19.3 (H) 07/28/2019   HGB 8.9 (L) 07/14/2019   HCT 29.2 (L) 07/20/2019   MCV 92.7 07/27/2019   PLT 311 08/01/2019    Lab Results  Component Value Date   CREATININE 0.68 07/31/2019   BUN 36 (H) 07/14/2019   NA 149 (H) 07/28/2019   K 4.8 07/16/2019   CL 116 (H) 07/24/2019   CO2 27 07/07/2019    Lab Results  Component Value Date   ALT 20 July 29, 2019   AST 26 07/11/2019   ALKPHOS 78 08/02/2019     Microbiology: Recent Results (from the past 240 hour(s))  Respiratory Panel by RT PCR (Flu A&B, Covid) - Nasopharyngeal Swab     Status: None   Collection Time: 08/01/2019  9:47 PM   Specimen: Nasopharyngeal Swab  Result Value Ref Range Status   SARS Coronavirus 2 by RT PCR NEGATIVE NEGATIVE Final    Comment: (NOTE) SARS-CoV-2 target nucleic acids are NOT DETECTED. The SARS-CoV-2 RNA is generally detectable in upper respiratoy specimens during the acute phase of infection. The lowest concentration of SARS-CoV-2 viral copies this assay can detect is 131 copies/mL. A negative result does not preclude SARS-Cov-2 infection and should not be used as the sole basis for treatment or other patient management decisions. A negative result may occur with  improper specimen collection/handling, submission of specimen other than nasopharyngeal swab, presence of viral mutation(s) within the areas targeted by this assay, and  inadequate number of viral copies (<131 copies/mL). A negative result must be combined with clinical observations, patient history, and epidemiological information. The expected result is Negative. Fact Sheet for Patients:  https://www.moore.com/ Fact Sheet for Healthcare Providers:  https://www.young.biz/ This test is not yet ap proved or cleared by the [place] FDA and  has been authorized for detection and/or diagnosis of SARS-CoV-2 by FDA under an Emergency Use Authorization (EUA). This EUA will remain  in effect (meaning this test can be used) for the duration of the COVID-19 declaration under Section 564(b)(1) of the Act, 21 U.S.C. section 360bbb-3(b)(1), unless the authorization is terminated or revoked sooner.    Influenza A by PCR NEGATIVE NEGATIVE Final   Influenza B by PCR NEGATIVE NEGATIVE Final    Comment: (NOTE) The Xpert Xpress SARS-CoV-2/FLU/RSV assay is intended as an aid in  the diagnosis of influenza from Nasopharyngeal swab specimens and  should not be used as a sole basis for treatment. Nasal washings and  aspirates are unacceptable for Xpert Xpress SARS-CoV-2/FLU/RSV  testing. Fact Sheet for Patients: https://www.moore.com/ Fact Sheet for Healthcare Providers: https://www.young.biz/ This test is not yet approved or cleared by the [place] FDA and  has been authorized for detection and/or diagnosis of SARS-CoV-2 by  FDA under an Emergency Use Authorization (EUA). This EUA will remain  in effect (meaning this test can be used)  for the duration of the  Covid-19 declaration under Section 564(b)(1) of the Act, 21  U.S.C. section 360bbb-3(b)(1), unless the authorization is  terminated or revoked. Performed at [facility], [address], [place], [place] [address]   MRSA PCR Screening     Status: None   Collection Time: 07/20/19  5:32 PM   Specimen: Nasal Mucosa;  Nasopharyngeal  Result Value Ref Range Status   MRSA by PCR NEGATIVE NEGATIVE Final    Comment:        The GeneXpert MRSA Assay (FDA approved for NASAL specimens only), is one component of a comprehensive MRSA colonization surveillance program. It is not intended to diagnose MRSA infection nor to guide or monitor treatment for MRSA infections. Performed at [facility], [address] [place], [place] [address]   Culture, blood (routine x 2)     Status: Abnormal   Collection Time: 07/26/19 10:03 AM   Specimen: BLOOD LEFT ARM  Result Value Ref Range Status   Specimen Description BLOOD LEFT ARM  Final   Special Requests   Final    BOTTLES DRAWN AEROBIC ONLY Blood Culture results may not be optimal due to an inadequate volume of blood received in culture bottles   Culture  Setup Time   Final    AEROBIC BOTTLE ONLY GRAM POSITIVE COCCI IN CLUSTERS CRITICAL RESULT CALLED TO, READ BACK BY AND VERIFIED WITH: PHRMD J MILLEN @0640  07/27/19 BY S GEZAHEGN Performed at [facility], [address], [place], [place]    Culture STAPHYLOCOCCUS AUREUS (A)  Final   Report Status 07/30/19 FINAL  Final   Organism ID, Bacteria STAPHYLOCOCCUS AUREUS  Final      Susceptibility   Staphylococcus aureus - MIC*    CIPROFLOXACIN <=0.5 SENSITIVE Sensitive     ERYTHROMYCIN <=0.25 SENSITIVE Sensitive     GENTAMICIN <=0.5 SENSITIVE Sensitive     OXACILLIN <=0.25 SENSITIVE Sensitive     TETRACYCLINE <=1 SENSITIVE Sensitive     VANCOMYCIN <=0.5 SENSITIVE Sensitive     TRIMETH/SULFA <=10 SENSITIVE Sensitive     CLINDAMYCIN <=0.25 SENSITIVE Sensitive     RIFAMPIN <=0.5 SENSITIVE Sensitive     Inducible Clindamycin NEGATIVE Sensitive     * STAPHYLOCOCCUS AUREUS  Blood Culture ID Panel (Reflexed)     Status: Abnormal   Collection Time: 07/26/19 10:03 AM  Result Value Ref Range Status   Enterococcus species NOT DETECTED NOT DETECTED Final   Listeria monocytogenes NOT DETECTED NOT  DETECTED Final   Staphylococcus species DETECTED (A) NOT DETECTED Final    Comment: CRITICAL RESULT CALLED TO, READ BACK BY AND VERIFIED WITH: PHRMD J MILLEN @0640  07/27/19 BY S GEZAHEGN    Staphylococcus aureus (BCID) DETECTED (A) NOT DETECTED Final    Comment: Methicillin (oxacillin) susceptible Staphylococcus aureus (MSSA). Preferred therapy is anti staphylococcal beta lactam antibiotic (Cefazolin or Nafcillin), unless clinically contraindicated. CRITICAL RESULT CALLED TO, READ BACK BY AND VERIFIED WITH: PHRMD J MILLEN @0640  07/27/19 BY S GEZAHEGN    Methicillin resistance NOT DETECTED NOT DETECTED Final   Streptococcus species NOT DETECTED NOT DETECTED Final   Streptococcus agalactiae NOT DETECTED NOT DETECTED Final   Streptococcus pneumoniae NOT DETECTED NOT DETECTED Final   Streptococcus pyogenes NOT DETECTED NOT DETECTED Final   Acinetobacter baumannii NOT DETECTED NOT DETECTED Final   Enterobacteriaceae species NOT DETECTED NOT DETECTED Final   Enterobacter cloacae complex NOT DETECTED NOT DETECTED Final   Escherichia coli NOT DETECTED NOT DETECTED Final  Klebsiella oxytoca NOT DETECTED NOT DETECTED Final   Klebsiella pneumoniae NOT DETECTED NOT DETECTED Final   Proteus species NOT DETECTED NOT DETECTED Final   Serratia marcescens NOT DETECTED NOT DETECTED Final   Haemophilus influenzae NOT DETECTED NOT DETECTED Final   Neisseria meningitidis NOT DETECTED NOT DETECTED Final   Pseudomonas aeruginosa NOT DETECTED NOT DETECTED Final   Candida albicans NOT DETECTED NOT DETECTED Final   Candida glabrata NOT DETECTED NOT DETECTED Final   Candida krusei NOT DETECTED NOT DETECTED Final   Candida parapsilosis NOT DETECTED NOT DETECTED Final   Candida tropicalis NOT DETECTED NOT DETECTED Final    Comment: Performed at [facility], [place] [address] [place], [place] [address]  Culture, respiratory (non-expectorated)     Status: None   Collection Time: 07/26/19 11:16 AM   Specimen:  Tracheal Aspirate; Respiratory  Result Value Ref Range Status   Specimen Description TRACHEAL ASPIRATE  Final   Special Requests NONE  Final   Gram Stain   Final    RARE WBC PRESENT, PREDOMINANTLY PMN MODERATE GRAM POSITIVE RODS RARE GRAM NEGATIVE COCCOBACILLI RARE GRAM POSITIVE COCCI IN CLUSTERS    Culture   Final    FEW SERRATIA MARCESCENS MODERATE GROUP B STREP(S.AGALACTIAE)ISOLATED ABUNDANT DIPHTHEROIDS(CORYNEBACTERIUM SPECIES) ORGANISM 2 TESTING AGAINST S. AGALACTIAE NOT ROUTINELY PERFORMED DUE TO PREDICTABILITY OF AMP/PEN/VAN SUSCEPTIBILITY. ORGANISM 3 Standardized susceptibility testing for this organism is not available. Performed at [facility], [place] [address], [place], [place] [address]    Report Status 07/10/2019 FINAL  Final   Organism ID, Bacteria SERRATIA MARCESCENS  Final      Susceptibility   Serratia marcescens - MIC*    CEFAZOLIN >=64 RESISTANT Resistant     CEFEPIME <=1 SENSITIVE Sensitive     CEFTAZIDIME <=1 SENSITIVE Sensitive     CEFTRIAXONE <=1 SENSITIVE Sensitive     CIPROFLOXACIN <=0.25 SENSITIVE Sensitive     GENTAMICIN <=1 SENSITIVE Sensitive     TRIMETH/SULFA <=20 SENSITIVE Sensitive     * FEW SERRATIA MARCESCENS  Culture, blood (routine x 2)     Status: Abnormal   Collection Time: 07/26/19  6:26 PM   Specimen: BLOOD LEFT HAND  Result Value Ref Range Status   Specimen Description BLOOD LEFT HAND  Final   Special Requests   Final    BOTTLES DRAWN AEROBIC ONLY Blood Culture results may not be optimal due to an inadequate volume of blood received in culture bottles   Culture  Setup Time   Final    AEROBIC BOTTLE ONLY GRAM POSITIVE COCCI IN CLUSTERS CRITICAL VALUE NOTED.  VALUE IS CONSISTENT WITH PREVIOUSLY REPORTED AND CALLED VALUE.    Culture (A)  Final    STAPHYLOCOCCUS AUREUS SUSCEPTIBILITIES PERFORMED ON PREVIOUS CULTURE WITHIN THE LAST 5 DAYS. Performed at [facility], [place] [address] [place], [place] [address]    Report Status  07/21/2019 FINAL  Final  Culture, blood (routine x 2)     Status: None (Preliminary result)   Collection Time: 07/27/19  2:29 PM   Specimen: BLOOD LEFT HAND  Result Value Ref Range Status   Specimen Description BLOOD LEFT HAND  Final   Special Requests   Final    BOTTLES DRAWN AEROBIC AND ANAEROBIC Blood Culture adequate volume   Culture  Setup Time   Final    AEROBIC BOTTLE ONLY GRAM POSITIVE COCCI IN CLUSTERS CRITICAL VALUE NOTED.  VALUE IS CONSISTENT WITH PREVIOUSLY REPORTED AND CALLED VALUE. Performed at [facility]  Lab, [address] [place], [place] [address]    Culture PENDING  Incomplete   Report Status PENDING  Incomplete  Culture, blood (routine x 2)     Status: None (Preliminary result)   Collection Time: 07/27/19  2:29 PM   Specimen: BLOOD LEFT ARM  Result Value Ref Range Status   Specimen Description BLOOD LEFT ARM  Final   Special Requests   Final    BOTTLES DRAWN AEROBIC AND ANAEROBIC Blood Culture adequate volume   Culture   Final    NO GROWTH < 24 HOURS Performed at [facility], [address] [place], [place] [address]    Report Status PENDING  Incomplete  Culture, blood (routine x 2)     Status: None (Preliminary result)   Collection Time: 08/13/2019 11:00 AM   Specimen: BLOOD RIGHT ARM  Result Value Ref Range Status   Specimen Description BLOOD RIGHT ARM  Final   Special Requests   Final    BOTTLES DRAWN AEROBIC AND ANAEROBIC Blood Culture results may not be optimal due to an inadequate volume of blood received in culture bottles Performed at [facility], 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  TEE with MV findings.  Will need 6 weeks of IV cefazolin once blood cultures show clearance.   TCTS consultation (called by Dr. Bedelia Person)  2.  Access - will need picc line for  long term IV antibiotics once blood cultures are negative 72 hours.    3.  Leukocytosis - Stable, somewhat improved.  Will continue to monitor.  4.  Fever - fever up to 103 again and c/w fever from #1.  May continue to have intermittent vegetation showering with fever.    5.  Pneumonia - Serratia in blood and resistant to cefazolin.  Ideal to use cefepime for this with possible AmpC resistance and will treat for 7 days, now day 3, then transition to cefazolin for #1.    Discussed with Dr. Bedelia Person  Dr. Drue Second on over the weekend if needed.   Dr. Daiva Eves on service on Monday and will follow up then.
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
## 2019-07-29 NOTE — Progress Notes (Addendum)
TEE results noted. D/w ID, Dr. Luciana Axe, will plan for PICC line placement and 6w abx from negative bcx. Currently on cefepime, preferred for Serratia in resp cx. Continue cefepime x7d, then de-escalate to cefaz. TCTS c/s for MVE. Attempted to reach family via phone to provide clinical update, but no answer, left VM.   Diamantina Monks, MD General and Trauma Surgery [facility] Surgery   Family reached, update provided.  Diamantina Monks, MD General and Trauma Surgery [facility]

## 2019-07-30 ENCOUNTER — Inpatient Hospital Stay (HOSPITAL_COMMUNITY): Payer: Medicaid Other

## 2019-07-30 DIAGNOSIS — J9500 Unspecified tracheostomy complication: Secondary | ICD-10-CM

## 2019-07-30 LAB — GLUCOSE, CAPILLARY
Glucose-Capillary: 155 mg/dL — ABNORMAL HIGH (ref 70–99)
Glucose-Capillary: 158 mg/dL — ABNORMAL HIGH (ref 70–99)
Glucose-Capillary: 184 mg/dL — ABNORMAL HIGH (ref 70–99)
Glucose-Capillary: 159 mg/dL — ABNORMAL HIGH (ref 70–99)
Glucose-Capillary: 160 mg/dL — ABNORMAL HIGH (ref 70–99)
Glucose-Capillary: 168 mg/dL — ABNORMAL HIGH (ref 70–99)

## 2019-07-30 LAB — BASIC METABOLIC PANEL
Anion gap: 5 (ref 5–15)
BUN: 29 mg/dL — ABNORMAL HIGH (ref 8–23)
CO2: 28 mmol/L (ref 22–32)
Calcium: 9.9 mg/dL (ref 8.9–10.3)
Chloride: 115 mmol/L — ABNORMAL HIGH (ref 98–111)
Creatinine, Ser: 0.61 mg/dL (ref 0.61–1.24)
GFR calc Af Amer: >60 mL/min (ref >60)
GFR calc non Af Amer: >60 mL/min (ref >60)
Glucose, Bld: 181 mg/dL — ABNORMAL HIGH (ref 70–99)
Potassium: 4.3 mmol/L (ref 3.5–5.1)
Sodium: 148 mmol/L — ABNORMAL HIGH (ref 135–145)

## 2019-07-30 LAB — CBC
HCT: 31.3 % — ABNORMAL LOW (ref 39.0–52.0)
Hemoglobin: 9.6 g/dL — ABNORMAL LOW (ref 13.0–17.0)
MCH: 28.1 pg (ref 26.0–34.0)
MCHC: 30.7 g/dL (ref 30.0–36.0)
MCV: 91.5 fL (ref 80.0–100.0)
Platelets: 369 10*3/uL (ref 150–400)
RBC: 3.42 MIL/uL — ABNORMAL LOW (ref 4.22–5.81)
RDW: 14.1 % (ref 11.5–15.5)
WBC: 23.7 10*3/uL — ABNORMAL HIGH (ref 4.0–10.5)
nRBC: 0 % (ref 0.0–0.2)

## 2019-07-30 LAB — PHOSPHORUS: Phosphorus: 2 mg/dL — ABNORMAL LOW (ref 2.5–4.6)

## 2019-07-30 LAB — MAGNESIUM: Magnesium: 2.3 mg/dL (ref 1.7–2.4)

## 2019-07-30 MED ORDER — SODIUM PHOSPHATES 45 MMOLE/15ML IV SOLN
10.0000 mmol | Freq: Once | INTRAVENOUS | Status: AC
  Administered 2019-07-30: 10 mmol via INTRAVENOUS
  Filled 2019-07-30: qty 3.33

## 2019-07-30 MED ORDER — SODIUM CHLORIDE 0.9 % IV SOLN
2.0000 g | Freq: Once | INTRAVENOUS | Status: AC
  Administered 2019-07-30: 2 g via INTRAVENOUS
  Filled 2019-07-30: qty 20

## 2019-07-30 NOTE — Progress Notes (Signed)
Patient ID: Conroy Goracke, male   DOB: 1955/12/22, 64 y.o.   MRN: [id number] BP (!) 147/84   Pulse (!) 107   Temp (!) 101.3 F (38.5 C) (Axillary) Comment: Nurse notified  Resp (!) 22   Ht 5\' 6"  (1.676 m)   Wt 73 kg   SpO2 100%   BMI 25.98 kg/m  Eyes open, not following commands perrl Localizing, winces with noxious stimuli Diaphoretic No neurological changes Exam is unchanged.  Supportive care

## 2019-07-30 NOTE — Progress Notes (Signed)
Spoke with Clarisa Kindred, dtr, re PICC consent.  Telephone Consent obtained.

## 2019-07-30 NOTE — Progress Notes (Signed)
Called to pt's bedside to check cuff leak. Pt has audible leak with a fully inflated cuff. Attempted to reposition pt to see if the cuff leak would minimize, no success found. CCM MD at bedside to witness.

## 2019-07-30 NOTE — Progress Notes (Signed)
Patient ID: Stephen Juarez, male   DOB: 1955/12/14, 63 y.o.   MRN: [id number] Having some difficulty ventilating through the current tracheostomy.  This was placed on the 19th.  This was a #6 standard Sh[place].  I was able to remove the tracheostomy and replace it with a #6 extra long.  The tract is well-healed.  This was performed without difficulty.  Ventilation was much improved following the trach change.  Follow-up as needed.
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
## 2019-07-30 NOTE — Procedures (Signed)
Tracheostomy Change Note  Patient Details:   Name: Stephen Juarez DOB: 1955-11-08 MRN: [id number]    Airway Documentation:  Pt's #6 Shiley not occluding trachea, not getting full volumes. ENT Dr Pollyann Kennedy changed trach to a #6 Shiley XLT-Proximal. Patient no longer has cuff leak and is getting volumes.    Evaluation  O2 sats: stable throughout Complications: No apparent complications Patient did tolerate procedure well. Bilateral Breath Sounds: Rhonchi    [name] 07/30/2019, 2:58 PM

## 2019-07-30 NOTE — Progress Notes (Signed)
Spoke with Victorino Dike RN re PICC order.  Notified PICC to be placed after [facility] results x72 hours are negative, per ID notes.  Consent obtained.

## 2019-07-30 NOTE — Progress Notes (Signed)
RT NOTES: Patient has audible cuff leak even with cuff fully inflated.
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
## 2019-07-31 ENCOUNTER — Inpatient Hospital Stay (HOSPITAL_COMMUNITY): Payer: Medicaid Other

## 2019-07-31 LAB — CULTURE, BLOOD (ROUTINE X 2): Special Requests: ADEQUATE

## 2019-07-31 LAB — CALCIUM, IONIZED: Calcium, Ionized, Serum: 5.4 mg/dL (ref 4.5–5.6)

## 2019-07-31 LAB — BASIC METABOLIC PANEL
Anion gap: 7 (ref 5–15)
BUN: 27 mg/dL — ABNORMAL HIGH (ref 8–23)
CO2: 30 mmol/L (ref 22–32)
Calcium: 8.9 mg/dL (ref 8.9–10.3)
Chloride: 111 mmol/L (ref 98–111)
Creatinine, Ser: 0.63 mg/dL (ref 0.61–1.24)
GFR calc Af Amer: >60 mL/min (ref >60)
GFR calc non Af Amer: >60 mL/min (ref >60)
Glucose, Bld: 166 mg/dL — ABNORMAL HIGH (ref 70–99)
Potassium: 4.4 mmol/L (ref 3.5–5.1)
Sodium: 148 mmol/L — ABNORMAL HIGH (ref 135–145)

## 2019-07-31 LAB — CBC
HCT: 30.1 % — ABNORMAL LOW (ref 39.0–52.0)
Hemoglobin: 9.2 g/dL — ABNORMAL LOW (ref 13.0–17.0)
MCH: 28 pg (ref 26.0–34.0)
MCHC: 30.6 g/dL (ref 30.0–36.0)
MCV: 91.5 fL (ref 80.0–100.0)
Platelets: 455 10*3/uL — ABNORMAL HIGH (ref 150–400)
RBC: 3.29 MIL/uL — ABNORMAL LOW (ref 4.22–5.81)
RDW: 14.2 % (ref 11.5–15.5)
WBC: 23.4 10*3/uL — ABNORMAL HIGH (ref 4.0–10.5)
nRBC: 0 % (ref 0.0–0.2)

## 2019-07-31 LAB — GLUCOSE, CAPILLARY
Glucose-Capillary: 159 mg/dL — ABNORMAL HIGH (ref 70–99)
Glucose-Capillary: 144 mg/dL — ABNORMAL HIGH (ref 70–99)
Glucose-Capillary: 188 mg/dL — ABNORMAL HIGH (ref 70–99)
Glucose-Capillary: 125 mg/dL — ABNORMAL HIGH (ref 70–99)
Glucose-Capillary: 127 mg/dL — ABNORMAL HIGH (ref 70–99)
Glucose-Capillary: 144 mg/dL — ABNORMAL HIGH (ref 70–99)

## 2019-07-31 LAB — PHOSPHORUS: Phosphorus: 2.3 mg/dL — ABNORMAL LOW (ref 2.5–4.6)

## 2019-07-31 LAB — MAGNESIUM: Magnesium: 2.3 mg/dL (ref 1.7–2.4)

## 2019-07-31 MED ORDER — INSULIN ASPART 100 UNIT/ML ~~LOC~~ SOLN
1.0000 [IU] | SUBCUTANEOUS | Status: DC
  Administered 2019-07-31: 2 [IU] via SUBCUTANEOUS
  Administered 2019-07-31 (×3): 1 [IU] via SUBCUTANEOUS
  Administered 2019-08-01: 2 [IU] via SUBCUTANEOUS
  Administered 2019-08-01 (×2): 1 [IU] via SUBCUTANEOUS
  Administered 2019-08-01: 2 [IU] via SUBCUTANEOUS
  Administered 2019-08-01 (×2): 1 [IU] via SUBCUTANEOUS
  Administered 2019-08-02: 2 [IU] via SUBCUTANEOUS
  Administered 2019-08-02 – 2019-08-04 (×5): 1 [IU] via SUBCUTANEOUS
  Administered 2019-08-04: 2 [IU] via SUBCUTANEOUS
  Administered 2019-08-04 – 2019-08-07 (×11): 1 [IU] via SUBCUTANEOUS
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
## 2019-08-01 ENCOUNTER — Inpatient Hospital Stay (HOSPITAL_COMMUNITY): Payer: Medicaid Other

## 2019-08-01 DIAGNOSIS — J939 Pneumothorax, unspecified: Secondary | ICD-10-CM

## 2019-08-01 DIAGNOSIS — Z978 Presence of other specified devices: Secondary | ICD-10-CM

## 2019-08-01 LAB — CULTURE, BLOOD (ROUTINE X 2)
Culture: NO GROWTH
Special Requests: ADEQUATE

## 2019-08-01 LAB — POCT I-STAT 7, (LYTES, BLD GAS, ICA,H+H)
Acid-Base Excess: 8 mmol/L — ABNORMAL HIGH (ref 0.0–2.0)
Bicarbonate: 32.7 mmol/L — ABNORMAL HIGH (ref 20.0–28.0)
Calcium, Ion: 1.3 mmol/L (ref 1.15–1.40)
HCT: 29 % — ABNORMAL LOW (ref 39.0–52.0)
Hemoglobin: 9.9 g/dL — ABNORMAL LOW (ref 13.0–17.0)
O2 Saturation: 88 %
Patient temperature: 102.7
Potassium: 4.1 mmol/L (ref 3.5–5.1)
Sodium: 147 mmol/L — ABNORMAL HIGH (ref 135–145)
TCO2: 34 mmol/L — ABNORMAL HIGH (ref 22–32)
pCO2 arterial: 53.2 mmHg — ABNORMAL HIGH (ref 32.0–48.0)
pH, Arterial: 7.405 (ref 7.350–7.450)
pO2, Arterial: 64 mmHg — ABNORMAL LOW (ref 83.0–108.0)

## 2019-08-01 LAB — CBC
HCT: 30.9 % — ABNORMAL LOW (ref 39.0–52.0)
Hemoglobin: 9.4 g/dL — ABNORMAL LOW (ref 13.0–17.0)
MCH: 28.2 pg (ref 26.0–34.0)
MCHC: 30.4 g/dL (ref 30.0–36.0)
MCV: 92.8 fL (ref 80.0–100.0)
Platelets: 495 10*3/uL — ABNORMAL HIGH (ref 150–400)
RBC: 3.33 MIL/uL — ABNORMAL LOW (ref 4.22–5.81)
RDW: 14.1 % (ref 11.5–15.5)
WBC: 20 10*3/uL — ABNORMAL HIGH (ref 4.0–10.5)
nRBC: 0 % (ref 0.0–0.2)

## 2019-08-01 LAB — BASIC METABOLIC PANEL
Anion gap: 4 — ABNORMAL LOW (ref 5–15)
BUN: 27 mg/dL — ABNORMAL HIGH (ref 8–23)
CO2: 32 mmol/L (ref 22–32)
Calcium: 8.8 mg/dL — ABNORMAL LOW (ref 8.9–10.3)
Chloride: 111 mmol/L (ref 98–111)
Creatinine, Ser: 0.63 mg/dL (ref 0.61–1.24)
GFR calc Af Amer: >60 mL/min (ref >60)
GFR calc non Af Amer: >60 mL/min (ref >60)
Glucose, Bld: 166 mg/dL — ABNORMAL HIGH (ref 70–99)
Potassium: 4.5 mmol/L (ref 3.5–5.1)
Sodium: 147 mmol/L — ABNORMAL HIGH (ref 135–145)

## 2019-08-01 LAB — GLUCOSE, CAPILLARY
Glucose-Capillary: 126 mg/dL — ABNORMAL HIGH (ref 70–99)
Glucose-Capillary: 136 mg/dL — ABNORMAL HIGH (ref 70–99)
Glucose-Capillary: 175 mg/dL — ABNORMAL HIGH (ref 70–99)
Glucose-Capillary: 143 mg/dL — ABNORMAL HIGH (ref 70–99)
Glucose-Capillary: 123 mg/dL — ABNORMAL HIGH (ref 70–99)
Glucose-Capillary: 132 mg/dL — ABNORMAL HIGH (ref 70–99)

## 2019-08-01 LAB — MAGNESIUM: Magnesium: 2.3 mg/dL (ref 1.7–2.4)

## 2019-08-01 LAB — PHOSPHORUS: Phosphorus: 2.2 mg/dL — ABNORMAL LOW (ref 2.5–4.6)

## 2019-08-01 LAB — TRIGLYCERIDES: Triglycerides: 87 mg/dL (ref <150)

## 2019-08-01 MED ORDER — QUETIAPINE FUMARATE 200 MG PO TABS
200.0000 mg | ORAL_TABLET | Freq: Two times a day (BID) | ORAL | Status: DC
  Administered 2019-08-01 – 2019-08-03 (×5): 200 mg
  Filled 2019-08-01 (×5): qty 1

## 2019-08-01 MED ORDER — IOHEXOL 300 MG/ML  SOLN
100.0000 mL | Freq: Once | INTRAMUSCULAR | Status: AC | PRN
  Administered 2019-08-01: 18:00:00 100 mL via INTRAVENOUS

## 2019-08-01 MED ORDER — IBUPROFEN 100 MG/5ML PO SUSP: 600.0000 mg | Freq: Four times a day (QID) | ORAL | Status: DC | PRN

## 2019-08-01 MED ORDER — HALOPERIDOL LACTATE 5 MG/ML IJ SOLN: 5.0000 mg | Freq: Four times a day (QID) | INTRAMUSCULAR | Status: AC | PRN

## 2019-08-01 MED ORDER — FUROSEMIDE 10 MG/ML IJ SOLN
20.0000 mg | Freq: Once | INTRAMUSCULAR | Status: AC
  Administered 2019-08-01: 17:00:00 20 mg via INTRAVENOUS
  Filled 2019-08-01: qty 2

## 2019-08-01 MED ORDER — SODIUM PHOSPHATES 45 MMOLE/15ML IV SOLN
30.0000 mmol | Freq: Once | INTRAVENOUS | Status: AC
  Administered 2019-08-01: 30 mmol via INTRAVENOUS
  Filled 2019-08-01: qty 10

## 2019-08-01 MED ORDER — GLYCOPYRROLATE 0.2 MG/ML IJ SOLN
0.1000 mg | Freq: Three times a day (TID) | INTRAMUSCULAR | Status: DC
  Administered 2019-08-01 – 2019-08-03 (×7): 0.1 mg via INTRAVENOUS
  Filled 2019-08-01 (×7): qty 1

## 2019-08-01 MED ORDER — HALOPERIDOL LACTATE 5 MG/ML IJ SOLN
INTRAMUSCULAR | Status: AC
  Filled 2019-08-01: qty 1

## 2019-08-01 MED ORDER — IBUPROFEN 200 MG PO TABS
600.0000 mg | ORAL_TABLET | Freq: Four times a day (QID) | ORAL | Status: DC | PRN
  Administered 2019-08-01: 600 mg via ORAL
  Filled 2019-08-01: qty 3

## 2019-08-01 MED ORDER — GUAIFENESIN 100 MG/5ML PO SOLN: 10.0000 mL | ORAL | Status: DC | PRN

## 2019-08-01 MED ORDER — DEXTROSE 5 % IV SOLN: INTRAVENOUS | Status: AC

## 2019-08-01 MED ORDER — PROPOFOL 1000 MG/100ML IV EMUL
5.0000 ug/kg/min | INTRAVENOUS | Status: AC
  Administered 2019-08-01: 50 ug/kg/min via INTRAVENOUS
  Administered 2019-08-01: 5 ug/kg/min via INTRAVENOUS
  Administered 2019-08-01: 45 ug/kg/min via INTRAVENOUS
  Administered 2019-08-01: 12:00:00 40 ug/kg/min via INTRAVENOUS
  Administered 2019-08-02 (×2): 30 ug/kg/min via INTRAVENOUS
  Administered 2019-08-02: 04:00:00 50 ug/kg/min via INTRAVENOUS
  Administered 2019-08-03 – 2019-08-04 (×3): 30 ug/kg/min via INTRAVENOUS
  Administered 2019-08-04: 25 ug/kg/min via INTRAVENOUS
  Administered 2019-08-04: 01:00:00 40 ug/kg/min via INTRAVENOUS
  Administered 2019-08-05: 15 ug/kg/min via INTRAVENOUS
  Administered 2019-08-05: 10 ug/kg/min via INTRAVENOUS
  Administered 2019-08-06 – 2019-08-07 (×4): 30 ug/kg/min via INTRAVENOUS
  Administered 2019-08-07: 01:00:00 10 ug/kg/min via INTRAVENOUS
  Filled 2019-08-01 (×22): qty 100

## 2019-08-01 NOTE — Progress Notes (Signed)
Assisted tele visit to patient with daughter.  Sarye Kath D Delsin Copen, RN  

## 2019-08-01 NOTE — Progress Notes (Signed)
Overall stable.  Patient awakens but does not appear to be particularly aware.  Semipurposeful movements with both upper extremities.  Stable from our standpoint.  Continue supportive efforts.  No indication for surgical intervention.
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
## 2019-08-01 NOTE — Progress Notes (Signed)
Clinical update provided to daughter via phone. All questions answered. CT C/A/P ordered today.   Diamantina Monks, MD General and Trauma Surgery [facility] Surgery
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
## 2019-08-01 NOTE — Progress Notes (Signed)
Patient transported to CT & back on vent with no problems. 

## 2019-08-01 NOTE — Progress Notes (Signed)
Metoprolol given for HR 150-160. Will cont to monitor closely.
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
## 2019-08-01 NOTE — Progress Notes (Signed)
PT Cancellation Note  Patient Details Name: Stephen Juarez MRN: [id number] DOB: 1955-08-23   Cancelled Treatment:    Reason Eval/Treat Not Completed: Medical issues which prohibited therapy. Pts HR unstable going into 150s at rest. Pt very agitated requiring haldol as well. Acute PT to return as able, as appropriate.   Stephen Juarez, PT, DPT Acute Rehabilitation Services Pager #: [phone] Office #: [phone]    [name] 08/01/2019, 12:48 PM

## 2019-08-01 NOTE — Progress Notes (Signed)
Secure chatted Dr. Daiva Eves re: PICC order. Patient has a temp of 102.7 F. Per ID MD, awaits patient is clear to have the PICC or repeat blood culture resulted negative. Patient has 2 working PIVs, if needed will placed additional IV. RN made aware. MD will re enter PICC order if needed.

## 2019-08-02 ENCOUNTER — Inpatient Hospital Stay (HOSPITAL_COMMUNITY): Payer: Medicaid Other

## 2019-08-02 ENCOUNTER — Inpatient Hospital Stay: Payer: Self-pay

## 2019-08-02 DIAGNOSIS — L8990 Pressure ulcer of unspecified site, unspecified stage: Secondary | ICD-10-CM | POA: Insufficient documentation

## 2019-08-02 DIAGNOSIS — R509 Fever, unspecified: Secondary | ICD-10-CM

## 2019-08-02 LAB — GLUCOSE, CAPILLARY
Glucose-Capillary: 153 mg/dL — ABNORMAL HIGH (ref 70–99)
Glucose-Capillary: 132 mg/dL — ABNORMAL HIGH (ref 70–99)
Glucose-Capillary: 106 mg/dL — ABNORMAL HIGH (ref 70–99)
Glucose-Capillary: 104 mg/dL — ABNORMAL HIGH (ref 70–99)
Glucose-Capillary: 108 mg/dL — ABNORMAL HIGH (ref 70–99)
Glucose-Capillary: 95 mg/dL (ref 70–99)

## 2019-08-02 LAB — BASIC METABOLIC PANEL
Anion gap: 7 (ref 5–15)
BUN: 31 mg/dL — ABNORMAL HIGH (ref 8–23)
CO2: 30 mmol/L (ref 22–32)
Calcium: 8.3 mg/dL — ABNORMAL LOW (ref 8.9–10.3)
Chloride: 107 mmol/L (ref 98–111)
Creatinine, Ser: 0.72 mg/dL (ref 0.61–1.24)
GFR calc Af Amer: >60 mL/min (ref >60)
GFR calc non Af Amer: >60 mL/min (ref >60)
Glucose, Bld: 151 mg/dL — ABNORMAL HIGH (ref 70–99)
Potassium: 4.1 mmol/L (ref 3.5–5.1)
Sodium: 144 mmol/L (ref 135–145)

## 2019-08-02 LAB — CBC
HCT: 26.9 % — ABNORMAL LOW (ref 39.0–52.0)
Hemoglobin: 8.1 g/dL — ABNORMAL LOW (ref 13.0–17.0)
MCH: 28 pg (ref 26.0–34.0)
MCHC: 30.1 g/dL (ref 30.0–36.0)
MCV: 93.1 fL (ref 80.0–100.0)
Platelets: 521 10*3/uL — ABNORMAL HIGH (ref 150–400)
RBC: 2.89 MIL/uL — ABNORMAL LOW (ref 4.22–5.81)
RDW: 14.2 % (ref 11.5–15.5)
WBC: 14.1 10*3/uL — ABNORMAL HIGH (ref 4.0–10.5)
nRBC: 0 % (ref 0.0–0.2)

## 2019-08-02 LAB — HEPARIN LEVEL (UNFRACTIONATED): Heparin Unfractionated: <0.1 [IU]/mL — ABNORMAL LOW (ref 0.30–0.70)

## 2019-08-02 MED ORDER — ALBUMIN HUMAN 5 % IV SOLN
12.5000 g | Freq: Once | INTRAVENOUS | Status: AC
  Administered 2019-08-02: 12:00:00 12.5 g via INTRAVENOUS
  Filled 2019-08-02: qty 250

## 2019-08-02 MED ORDER — HEPARIN BOLUS VIA INFUSION
2000.0000 [IU] | Freq: Once | INTRAVENOUS | Status: AC
  Administered 2019-08-02: 2000 [IU] via INTRAVENOUS
  Filled 2019-08-02: qty 2000

## 2019-08-02 MED ORDER — OXYCODONE HCL 5 MG/5ML PO SOLN
10.0000 mg | Freq: Four times a day (QID) | ORAL | Status: DC
  Administered 2019-08-02 – 2019-08-08 (×24): 10 mg
  Filled 2019-08-02 (×24): qty 10

## 2019-08-02 MED ORDER — SODIUM CHLORIDE 0.9% FLUSH: 10.0000 mL | INTRAVENOUS | Status: DC | PRN

## 2019-08-02 MED ORDER — HEPARIN (PORCINE) 25000 UT/250ML-% IV SOLN
1450.0000 [IU]/h | INTRAVENOUS | Status: DC
  Administered 2019-08-02: 15:00:00 1200 [IU]/h via INTRAVENOUS
  Filled 2019-08-02 (×2): qty 250

## 2019-08-02 MED ORDER — HEPARIN BOLUS VIA INFUSION
4300.0000 [IU] | Freq: Once | INTRAVENOUS | Status: AC
  Administered 2019-08-02: 15:00:00 4300 [IU] via INTRAVENOUS
  Filled 2019-08-02: qty 4300

## 2019-08-02 MED ORDER — SODIUM CHLORIDE 0.9% FLUSH
10.0000 mL | Freq: Two times a day (BID) | INTRAVENOUS | Status: DC
  Administered 2019-08-03 – 2019-08-05 (×4): 10 mL
  Administered 2019-08-06: 09:00:00 40 mL
  Administered 2019-08-06 – 2019-08-08 (×4): 10 mL
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
## 2019-08-02 NOTE — Progress Notes (Signed)
Assisted tele visit to patient with family member.  Cayenne Breault R, RN  
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
## 2019-08-02 NOTE — Progress Notes (Signed)
Peripherally Inserted Central Catheter Placement  The IV Nurse has discussed with the patient and/or persons authorized to consent for the patient, the purpose of this procedure and the potential benefits and risks involved with this procedure.  The benefits include less needle sticks, lab draws from the catheter, and the patient may be discharged home with the catheter. Risks include, but not limited to, infection, bleeding, blood clot (thrombus formation), and puncture of an artery; nerve damage and irregular heartbeat and possibility to perform a PICC exchange if needed/ordered by physician.  Alternatives to this procedure were also discussed.  Bard Power PICC patient education guide, fact sheet on infection prevention and patient information card has been provided to patient /or left at bedside.    PICC Placement Documentation  PICC Double Lumen 08/02/19 PICC Right Brachial 37 cm 0 cm (Active)  Indication for Insertion or Continuance of Line Prolonged intravenous therapies 08/02/19 1800  Exposed Catheter (cm) 0 cm 08/02/19 1800  Site Assessment Clean;Dry;Intact 08/02/19 1800  Lumen #1 Status Flushed;Saline locked;Blood return noted 08/02/19 1800  Lumen #2 Status Flushed;Saline locked;Blood return noted 08/02/19 1800  Dressing Type Transparent;Securing device 08/02/19 1800  Dressing Status Clean;Dry;Intact;Antimicrobial disc in place 08/02/19 1800  Dressing Change Due 08/09/19 08/02/19 1800       Timmothy Sours 08/02/2019, 6:23 PM

## 2019-08-02 NOTE — Progress Notes (Signed)
Patient ID: Stephen Juarez, male   DOB: December 16, 1955, 64 y.o.   MRN: [id number] I met with his son and daughter in the room as well as another daughter on video conference to discuss his current clinical situation and goals of care.  I updated them regarding his neurologic status and failure to have much improvement.  We also spoke about his significant ventilator support needs as well as the empyema in his right chest.  I discussed this with Dr. Kipp Brood.  He feels his ventilator requirements at this time including PEEP of 12 are too high for him to tolerate a VATS.  He recommends interventional radiology for a drain placement.  We will order that.  Additionally, preliminary result questions DVT left upper extremity.  We will await the final reading on that study and likely have to start a heparin drip.  I went over all of these things with his family.  We went over the possibility that he may never wake up further and may need placement in a facility long-term.  They would like to continue all aggressive care at this time.  They do not want to make him a DNR either.  They said they will continue to have discussions regarding goals of care.  Georganna Skeans, MD, MPH, FACS Please use [email or web address] to contact on call provider
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
## 2019-08-02 NOTE — Progress Notes (Signed)
OT Cancellation Note  Patient Details Name: Stephen Juarez MRN: [id number] DOB: 05/20/1955   Cancelled Treatment:    Reason Eval/Treat Not Completed: Medical issues which prohibited therapy(Positive for DVT in LUE. Still undergoing vascular lab. Will await till medically ready. Thank you.)  Tally Mckinnon M Adya Wirz Vanya Carberry MSOT, OTR/L Acute Rehab Pager: [phone] Office: [phone] 08/02/2019, 10:18 AM

## 2019-08-02 NOTE — Progress Notes (Signed)
Bilateral lower extremity and upper extremity venous duplexes completed. Refer to "CV Proc" under chart review to view preliminary results.  08/02/2019 10:52 AM Eula Fried., MHA, RVT, RDCS, RDMS

## 2019-08-02 NOTE — Progress Notes (Signed)
PT Cancellation Note  Patient Details Name: Stephen Juarez MRN: [id number] DOB: [birth year]   Cancelled Treatment:    Reason Eval/Treat Not Completed: Patient not medically ready. Pt found to have +DVT in L UE and not on anti-coagulant at this time per RN. PT to return as able once cleared for mobility.  Lewis Shock, PT, DPT Acute Rehabilitation Services Pager #: [phone] Office #: [phone]    [name] 08/02/2019, 10:19 AM
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
## 2019-08-02 NOTE — Progress Notes (Signed)
SLP Cancellation Note  Patient Details Name: Stephen Juarez MRN: [id number] DOB: Aug 20, 1955   Cancelled treatment:       Reason Eval/Treat Not Completed: Medical issues which prohibited therapy. Pt remains on the vent, requiring sedation. Will continue to follow.    Mahala Menghini., M.A. CCC-SLP Acute Rehabilitation Services Pager [phone] Office [phone]  08/02/2019, 7:52 AM

## 2019-08-02 NOTE — Progress Notes (Signed)
     [address]       [place] [address]             [phone]       64 year old male previously consulted by our service for mitral valve endocarditis.  We will contact again with the trauma team for assistance with management of a right pleural effusion concerning for empyema.  Patient is currently ventilated on high vent settings with PEEP of 12 and has recently undergone tracheostomy exchange.  I personally reviewed the cross-sectional imaging from yesterday and I agree that he has a loculated pleural fluid collection but this should be amenable to CT-guided drainage given the severity of his respiratory status right now.  Once he is on lower vent settings we will rediscuss options for surgical decortication.  Samaria Anes Keane Scrape
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
## 2019-08-02 NOTE — Progress Notes (Signed)
Subjective: Patient mechanically ventilated  Antibiotics:  Anti-infectives (From admission, onward)   Start     Dose/Rate Route Frequency Ordered Stop   07/27/19 1630  ceFEPIme (MAXIPIME) 2 g in sodium chloride 0.9 % 100 mL IVPB     2 g 200 mL/hr over 30 Minutes Intravenous Every 8 hours 07/27/19 1616     07/27/19 1400  ceFAZolin (ANCEF) IVPB 2g/100 mL premix  Status:  Discontinued     2 g 200 mL/hr over 30 Minutes Intravenous Every 8 hours 07/27/19 1005 07/27/19 1611   07/27/19 0700  ceFEPIme (MAXIPIME) 2 g in sodium chloride 0.9 % 100 mL IVPB  Status:  Discontinued     2 g 200 mL/hr over 30 Minutes Intravenous Every 8 hours 07/27/19 0649 07/27/19 1005      Medications: Scheduled Meds: . acetaminophen  1,000 mg Per Tube Q6H  . bethanechol  25 mg Per Tube TID  . chlorhexidine gluconate (MEDLINE KIT)  15 mL Mouth Rinse BID  . Chlorhexidine Gluconate Cloth  6 each Topical Daily  . clonazePAM  0.5 mg Per Tube Daily  . docusate  100 mg Per Tube BID  . enoxaparin (LOVENOX) injection  30 mg Subcutaneous Q12H  . feeding supplement (PRO-STAT SUGAR FREE 64)  30 mL Per Tube Daily  . glycopyrrolate  0.1 mg Intravenous TID  . insulin aspart  1-3 Units Subcutaneous Q4H  . mouth rinse  15 mL Mouth Rinse 10 times per day  . methocarbamol  1,000 mg Per Tube Q8H  . multivitamin with minerals  1 tablet Per Tube Daily  . oxyCODONE  10 mg Per Tube Q6H  . pantoprazole sodium  40 mg Per Tube Daily  . polyethylene glycol  17 g Per Tube BID  . QUEtiapine  200 mg Per Tube BID  . sodium chloride flush  10-40 mL Intracatheter Q12H   Continuous Infusions: . sodium chloride 10 mL/hr at 08/02/19 1000  . sodium chloride    . ceFEPime (MAXIPIME) IV Stopped (08/02/19 6226)  . feeding supplement (PIVOT 1.5 CAL) 1,000 mL (08/01/19 0233)  . fentaNYL infusion INTRAVENOUS 125 mcg/hr (08/02/19 1144)  . propofol (DIPRIVAN) infusion 30 mcg/kg/min (08/02/19 1009)   PRN Meds:.sodium chloride,  Place/Maintain arterial line **AND** sodium chloride, guaiFENesin, ibuprofen, metoprolol tartrate, midazolam, morphine injection, ondansetron **OR** ondansetron (ZOFRAN) IV, sodium chloride flush    Objective: Weight change:   Intake/Output Summary (Last 24 hours) at 08/02/2019 1228 Last data filed at 08/02/2019 1000 Gross per 24 hour  Intake 3263.01 ml  Output 2050 ml  Net 1213.01 ml   Blood pressure 102/61, pulse (!) 118, temperature 100 F (37.8 C), resp. rate (!) 26, height '5\' 6"'$  (1.676 m), weight 72.1 kg, SpO2 98 %. Temp:  [98.5 F (36.9 C)-100 F (37.8 C)] 100 F (37.8 C) (04/27 1200) Pulse Rate:  [29-129] 118 (04/27 1200) Resp:  [21-53] 26 (04/27 1200) BP: (91-124)/(54-72) 102/61 (04/27 1200) SpO2:  [91 %-100 %] 98 % (04/27 1200) FiO2 (%):  [50 %-80 %] 50 % (04/27 1124) Weight:  [72.1 kg] 72.1 kg (04/27 0500)  Physical Exam: General on the ventilator HEENT: anicteric sclera, pupils equal no exudates  CVS tachycardic rate, murmurs gallops or rubs heard Chest: , On ventilator coarse breath sounds  abdomen: soft non-distended,   Neuro: nonfocal  CBC:    BMET Recent Labs    08/01/19 0446 08/01/19 0446 08/01/19 0900 08/02/19 0459  NA 147*   < > 147* 144  K 4.5   < > 4.1 4.1  CL 111  --   --  107  CO2 32  --   --  30  GLUCOSE 166*  --   --  151*  BUN 27*  --   --  31*  CREATININE 0.63  --   --  0.72  CALCIUM 8.8*  --   --  8.3*   < > = values in this interval not displayed.     Liver Panel  No results for input(s): PROT, ALBUMIN, AST, ALT, ALKPHOS, BILITOT, BILIDIR, IBILI in the last 72 hours.     Sedimentation Rate No results for input(s): ESRSEDRATE in the last 72 hours. C-Reactive Protein No results for input(s): CRP in the last 72 hours.  Micro Results: Recent Results (from the past 720 hour(s))  Respiratory Panel by RT PCR (Flu A&B, Covid) - Nasopharyngeal Swab     Status: None   Collection Time: 07/08/2019  9:47 PM   Specimen:  Nasopharyngeal Swab  Result Value Ref Range Status   SARS Coronavirus 2 by RT PCR NEGATIVE NEGATIVE Final    Comment: (NOTE) SARS-CoV-2 target nucleic acids are NOT DETECTED. The SARS-CoV-2 RNA is generally detectable in upper respiratoy specimens during the acute phase of infection. The lowest concentration of SARS-CoV-2 viral copies this assay can detect is 131 copies/mL. A negative result does not preclude SARS-Cov-2 infection and should not be used as the sole basis for treatment or other patient management decisions. A negative result may occur with  improper specimen collection/handling, submission of specimen other than nasopharyngeal swab, presence of viral mutation(s) within the areas targeted by this assay, and inadequate number of viral copies (<131 copies/mL). A negative result must be combined with clinical observations, patient history, and epidemiological information. The expected result is Negative. Fact Sheet for Patients:  PinkCheek.be Fact Sheet for Healthcare Providers:  GravelBags.it This test is not yet ap proved or cleared by the [place] FDA and  has been authorized for detection and/or diagnosis of SARS-CoV-2 by FDA under an Emergency Use Authorization (EUA). This EUA will remain  in effect (meaning this test can be used) for the duration of the COVID-19 declaration under Section 564(b)(1) of the Act, 21 U.S.C. section 360bbb-3(b)(1), unless the authorization is terminated or revoked sooner.    Influenza A by PCR NEGATIVE NEGATIVE Final   Influenza B by PCR NEGATIVE NEGATIVE Final    Comment: (NOTE) The Xpert Xpress SARS-CoV-2/FLU/RSV assay is intended as an aid in  the diagnosis of influenza from Nasopharyngeal swab specimens and  should not be used as a sole basis for treatment. Nasal washings and  aspirates are unacceptable for Xpert Xpress SARS-CoV-2/FLU/RSV  testing. Fact Sheet for  Patients: PinkCheek.be Fact Sheet for Healthcare Providers: GravelBags.it This test is not yet approved or cleared by the [place] FDA and  has been authorized for detection and/or diagnosis of SARS-CoV-2 by  FDA under an Emergency Use Authorization (EUA). This EUA will remain  in effect (meaning this test can be used) for the duration of the  Covid-19 declaration under Section 564(b)(1) of the Act, 21  U.S.C. section 360bbb-3(b)(1), unless the authorization is  terminated or revoked. Performed at [facility], [place] [address], [place], [place] [address]   MRSA PCR Screening     Status: None   Collection Time: 07/20/19  5:32 PM   Specimen: Nasal Mucosa; Nasopharyngeal  Result Value Ref Range Status   MRSA by PCR NEGATIVE NEGATIVE  Final    Comment:        The GeneXpert MRSA Assay (FDA approved for NASAL specimens only), is one component of a comprehensive MRSA colonization surveillance program. It is not intended to diagnose MRSA infection nor to guide or monitor treatment for MRSA infections. Performed at [facility], [place] [address] [place], [place] [address]   Culture, blood (routine x 2)     Status: Abnormal   Collection Time: 07/26/19 10:03 AM   Specimen: BLOOD LEFT ARM  Result Value Ref Range Status   Specimen Description BLOOD LEFT ARM  Final   Special Requests   Final    BOTTLES DRAWN AEROBIC ONLY Blood Culture results may not be optimal due to an inadequate volume of blood received in culture bottles   Culture  Setup Time   Final    AEROBIC BOTTLE ONLY GRAM POSITIVE COCCI IN CLUSTERS CRITICAL RESULT CALLED TO, READ BACK BY AND VERIFIED WITH: PHRMD J MILLEN '@0640'$  07/27/19 BY S GEZAHEGN Performed at [facility], [address], [place], [place] [address]    Culture STAPHYLOCOCCUS AUREUS (A)  Final   Report Status 07/09/2019 FINAL  Final   Organism ID, Bacteria STAPHYLOCOCCUS AUREUS   Final      Susceptibility   Staphylococcus aureus - MIC*    CIPROFLOXACIN <=0.5 SENSITIVE Sensitive     ERYTHROMYCIN <=0.25 SENSITIVE Sensitive     GENTAMICIN <=0.5 SENSITIVE Sensitive     OXACILLIN <=0.25 SENSITIVE Sensitive     TETRACYCLINE <=1 SENSITIVE Sensitive     VANCOMYCIN <=0.5 SENSITIVE Sensitive     TRIMETH/SULFA <=10 SENSITIVE Sensitive     CLINDAMYCIN <=0.25 SENSITIVE Sensitive     RIFAMPIN <=0.5 SENSITIVE Sensitive     Inducible Clindamycin NEGATIVE Sensitive     * STAPHYLOCOCCUS AUREUS  Blood Culture ID Panel (Reflexed)     Status: Abnormal   Collection Time: 07/26/19 10:03 AM  Result Value Ref Range Status   Enterococcus species NOT DETECTED NOT DETECTED Final   Listeria monocytogenes NOT DETECTED NOT DETECTED Final   Staphylococcus species DETECTED (A) NOT DETECTED Final    Comment: CRITICAL RESULT CALLED TO, READ BACK BY AND VERIFIED WITH: PHRMD J MILLEN '@0640'$  07/27/19 BY S GEZAHEGN    Staphylococcus aureus (BCID) DETECTED (A) NOT DETECTED Final    Comment: Methicillin (oxacillin) susceptible Staphylococcus aureus (MSSA). Preferred therapy is anti staphylococcal beta lactam antibiotic (Cefazolin or Nafcillin), unless clinically contraindicated. CRITICAL RESULT CALLED TO, READ BACK BY AND VERIFIED WITH: PHRMD J MILLEN '@0640'$  07/27/19 BY S GEZAHEGN    Methicillin resistance NOT DETECTED NOT DETECTED Final   Streptococcus species NOT DETECTED NOT DETECTED Final   Streptococcus agalactiae NOT DETECTED NOT DETECTED Final   Streptococcus pneumoniae NOT DETECTED NOT DETECTED Final   Streptococcus pyogenes NOT DETECTED NOT DETECTED Final   Acinetobacter baumannii NOT DETECTED NOT DETECTED Final   Enterobacteriaceae species NOT DETECTED NOT DETECTED Final   Enterobacter cloacae complex NOT DETECTED NOT DETECTED Final   Escherichia coli NOT DETECTED NOT DETECTED Final   Klebsiella oxytoca NOT DETECTED NOT DETECTED Final   Klebsiella pneumoniae NOT DETECTED NOT DETECTED  Final   Proteus species NOT DETECTED NOT DETECTED Final   Serratia marcescens NOT DETECTED NOT DETECTED Final   Haemophilus influenzae NOT DETECTED NOT DETECTED Final   Neisseria meningitidis NOT DETECTED NOT DETECTED Final   Pseudomonas aeruginosa NOT DETECTED NOT DETECTED Final   Candida albicans NOT DETECTED NOT DETECTED Final   Candida glabrata NOT DETECTED  NOT DETECTED Final   Candida krusei NOT DETECTED NOT DETECTED Final   Candida parapsilosis NOT DETECTED NOT DETECTED Final   Candida tropicalis NOT DETECTED NOT DETECTED Final    Comment: Performed at [facility], [address], [place], [place] [address]  Culture, respiratory (non-expectorated)     Status: None   Collection Time: 07/26/19 11:16 AM   Specimen: Tracheal Aspirate; Respiratory  Result Value Ref Range Status   Specimen Description TRACHEAL ASPIRATE  Final   Special Requests NONE  Final   Gram Stain   Final    RARE WBC PRESENT, PREDOMINANTLY PMN MODERATE GRAM POSITIVE RODS RARE GRAM NEGATIVE COCCOBACILLI RARE GRAM POSITIVE COCCI IN CLUSTERS    Culture   Final    FEW SERRATIA MARCESCENS MODERATE GROUP B STREP(S.AGALACTIAE)ISOLATED ABUNDANT DIPHTHEROIDS(CORYNEBACTERIUM SPECIES) ORGANISM 2 TESTING AGAINST S. AGALACTIAE NOT ROUTINELY PERFORMED DUE TO PREDICTABILITY OF AMP/PEN/VAN SUSCEPTIBILITY. ORGANISM 3 Standardized susceptibility testing for this organism is not available. Performed at [facility], [place] [address], [place], [place] [address]    Report Status 07/08/2019 FINAL  Final   Organism ID, Bacteria SERRATIA MARCESCENS  Final      Susceptibility   Serratia marcescens - MIC*    CEFAZOLIN >=64 RESISTANT Resistant     CEFEPIME <=1 SENSITIVE Sensitive     CEFTAZIDIME <=1 SENSITIVE Sensitive     CEFTRIAXONE <=1 SENSITIVE Sensitive     CIPROFLOXACIN <=0.25 SENSITIVE Sensitive     GENTAMICIN <=1 SENSITIVE Sensitive     TRIMETH/SULFA <=20 SENSITIVE Sensitive     * FEW SERRATIA MARCESCENS    Culture, blood (routine x 2)     Status: Abnormal   Collection Time: 07/26/19  6:26 PM   Specimen: BLOOD LEFT HAND  Result Value Ref Range Status   Specimen Description BLOOD LEFT HAND  Final   Special Requests   Final    BOTTLES DRAWN AEROBIC ONLY Blood Culture results may not be optimal due to an inadequate volume of blood received in culture bottles   Culture  Setup Time   Final    AEROBIC BOTTLE ONLY GRAM POSITIVE COCCI IN CLUSTERS CRITICAL VALUE NOTED.  VALUE IS CONSISTENT WITH PREVIOUSLY REPORTED AND CALLED VALUE.    Culture (A)  Final    STAPHYLOCOCCUS AUREUS SUSCEPTIBILITIES PERFORMED ON PREVIOUS CULTURE WITHIN THE LAST 5 DAYS. Performed at [facility], [place] [address], [place], [place] [address]    Report Status 07/15/2019 FINAL  Final  Culture, blood (routine x 2)     Status: Abnormal   Collection Time: 07/27/19  2:29 PM   Specimen: BLOOD LEFT HAND  Result Value Ref Range Status   Specimen Description BLOOD LEFT HAND  Final   Special Requests   Final    BOTTLES DRAWN AEROBIC AND ANAEROBIC Blood Culture adequate volume   Culture  Setup Time   Final    IN BOTH AEROBIC AND ANAEROBIC BOTTLES GRAM POSITIVE COCCI IN CLUSTERS CRITICAL VALUE NOTED.  VALUE IS CONSISTENT WITH PREVIOUSLY REPORTED AND CALLED VALUE.    Culture (A)  Final    STAPHYLOCOCCUS AUREUS SUSCEPTIBILITIES PERFORMED ON PREVIOUS CULTURE WITHIN THE LAST 5 DAYS. Performed at [facility], [place] [address] [place], [place] [address]    Report Status 07/31/2019 FINAL  Final  Culture, blood (routine x 2)     Status: None   Collection Time: 07/27/19  2:29 PM   Specimen: BLOOD LEFT ARM  Result Value Ref Range Status   Specimen Description  BLOOD LEFT ARM  Final   Special Requests   Final    BOTTLES DRAWN AEROBIC AND ANAEROBIC Blood Culture adequate volume   Culture   Final    NO GROWTH 5 DAYS Performed at [facility], [address] [place], [place] [address]    Report Status 08/01/2019  FINAL  Final  Culture, blood (routine x 2)     Status: None (Preliminary result)   Collection Time: 07/26/2019 10:48 AM   Specimen: BLOOD RIGHT HAND  Result Value Ref Range Status   Specimen Description BLOOD RIGHT HAND  Final   Special Requests   Final    BOTTLES DRAWN AEROBIC AND ANAEROBIC Blood Culture adequate volume   Culture   Final    NO GROWTH 4 DAYS Performed at [facility], [place] [address], [place], [place] [address]    Report Status PENDING  Incomplete  Culture, blood (routine x 2)     Status: None (Preliminary result)   Collection Time: 07/18/2019 11:00 AM   Specimen: BLOOD RIGHT ARM  Result Value Ref Range Status   Specimen Description BLOOD RIGHT ARM  Final   Special Requests   Final    BOTTLES DRAWN AEROBIC AND ANAEROBIC Blood Culture results may not be optimal due to an inadequate volume of blood received in culture bottles   Culture   Final    NO GROWTH 4 DAYS Performed at [facility], [place] [address], [place], [place] [address]    Report Status PENDING  Incomplete  Culture, respiratory (non-expectorated)     Status: None (Preliminary result)   Collection Time: 08/01/19  9:35 AM   Specimen: Tracheal Aspirate; Respiratory  Result Value Ref Range Status   Specimen Description TRACHEAL ASPIRATE  Final   Special Requests NONE  Final   Gram Stain   Final    ABUNDANT WBC PRESENT, PREDOMINANTLY PMN FEW SQUAMOUS EPITHELIAL CELLS PRESENT FEW GRAM NEGATIVE COCCOBACILLI RARE GRAM POSITIVE COCCI IN PAIRS RARE GRAM POSITIVE RODS Performed at [facility], [place] [address] [place], [place] [address]    Culture FEW SERRATIA MARCESCENS  Final   Report Status PENDING  Incomplete  Fungus culture, blood     Status: None (Preliminary result)   Collection Time: 08/01/19  5:10 PM   Specimen: BLOOD  Result Value Ref Range Status   Specimen Description BLOOD LEFT ANTECUBITAL  Final   Special Requests   Final    BOTTLES DRAWN AEROBIC AND ANAEROBIC Blood Culture  adequate volume   Culture   Final    NO GROWTH < 12 HOURS Performed at [facility], [place] [address] [place],  [address]    Report Status PENDING  Incomplete    Studies/Results: CT CHEST W CONTRAST  Result Date: 08/01/2019 CLINICAL DATA:  History of empyema and fevers EXAM: CT CHEST, ABDOMEN, AND PELVIS WITH CONTRAST TECHNIQUE: Multidetector CT imaging of the chest, abdomen and pelvis was performed following the standard protocol during bolus administration of intravenous contrast. CONTRAST:  150m OMNIPAQUE IOHEXOL 300 MG/ML  SOLN COMPARISON:  CT of the chest from 07/27/2019, CT of the chest abdomen and pelvis from 07/09/2019 FINDINGS: CT CHEST FINDINGS Cardiovascular: No significant cardiac enlargement is noted. Mild atherosclerotic calcifications noted. The aorta shows no aneurysmal dilatation or dissection. No large central embolus is seen. Right-sided central venous catheter has been removed in the interval. Mediastinum/Nodes: The thoracic inlet demonstrates evidence of a tracheostomy tube in satisfactory position. Esophagus as visualized is  within normal limits. No sizable hilar or mediastinal adenopathy is noted. Thyroid is within normal limits. Lungs/Pleura: Airspace opacity is identified increased in the left upper lobe consistent with new infiltrate. Consolidation of the left lower lobe is again identified with associated small pleural effusion. Consolidation of the right lower lobe is again identified similar to that seen on the most recent CT examination. Large pleural effusion on the right is again identified with some peripheral enhancement suggestive of empyema. A somewhat loculated component of fluid is noted along the medial aspect of the right apex but communicates with the more inferior effusion.Previously seen right chest tube has been removed in the interval. No pneumothorax is identified. No parenchymal nodules are identified. Musculoskeletal: Rib fractures are again  identified on the right similar to that seen on prior exam. These involve the third through ninth ribs on the right. Previously seen scapular fracture on the right is again noted as well. No compression deformities are seen within the thoracic spine. In the right anterior chest wall, there is a focal fluid collection identified which measures 5.9 x 2.0 by 6.6 cm in greatest transverse, AP and craniocaudad projections. This likely represents a focal posttraumatic hematoma but is more organized when compared with the prior exams. No findings to suggest superinfection are noted at this time. CT ABDOMEN PELVIS FINDINGS Hepatobiliary: No focal liver abnormality is seen. No gallstones, gallbladder wall thickening, or biliary dilatation. Pancreas: Unremarkable. No pancreatic ductal dilatation or surrounding inflammatory changes. Spleen: Normal in size without focal abnormality. Adrenals/Urinary Tract: Adrenal glands are within normal limits bilaterally. Kidneys demonstrate a normal enhancement pattern. No renal calculi or obstructive changes are noted. The bladder is well distended. Stomach/Bowel: No obstructive or inflammatory changes of the large or small bowel are seen. A gastrostomy catheter is noted in place. The appendix is not well visualized although no inflammatory changes are seen. Vascular/Lymphatic: Aortic atherosclerosis. No enlarged abdominal or pelvic lymph nodes. Reproductive: Prostate is unremarkable. Other: No free fluid is noted.  No hernia is seen. Musculoskeletal: Compression deformity of L2 is noted stable in appearance right inferior pubic ramus fracture and right acetabular fracture are again seen and stable. IMPRESSION: CT of the chest: Increasing right-sided pleural effusion with evidence of loculation and peripheral enhancement suggestive of empyema. New fluid collection in the anterior chest wall on the right likely related to focal hematoma. No signs of superinfection are seen. New left upper  lobe infiltrate consistent with pneumonia. This was not visualized on the recent exam from 07/27/2019. Persistent consolidation within the lower lobes bilaterally right greater than left. Multiple rib fractures and right scapular fracture stable in appearance. CT of the abdomen and pelvis: Fractures involving the right inferior pubic ramus and right acetabulum stable from the prior study. Chronic L2 compression deformity. No new focal abnormality is noted within the abdomen and pelvis. Electronically Signed   By: Inez Catalina M.D.   On: 08/01/2019 18:54   CT ABDOMEN PELVIS W CONTRAST  Result Date: 08/01/2019 CLINICAL DATA:  History of empyema and fevers EXAM: CT CHEST, ABDOMEN, AND PELVIS WITH CONTRAST TECHNIQUE: Multidetector CT imaging of the chest, abdomen and pelvis was performed following the standard protocol during bolus administration of intravenous contrast. CONTRAST:  180m OMNIPAQUE IOHEXOL 300 MG/ML  SOLN COMPARISON:  CT of the chest from 07/27/2019, CT of the chest abdomen and pelvis from 07/28/2019 FINDINGS: CT CHEST FINDINGS Cardiovascular: No significant cardiac enlargement is noted. Mild atherosclerotic calcifications noted. The aorta shows no aneurysmal dilatation or dissection.  No large central embolus is seen. Right-sided central venous catheter has been removed in the interval. Mediastinum/Nodes: The thoracic inlet demonstrates evidence of a tracheostomy tube in satisfactory position. Esophagus as visualized is within normal limits. No sizable hilar or mediastinal adenopathy is noted. Thyroid is within normal limits. Lungs/Pleura: Airspace opacity is identified increased in the left upper lobe consistent with new infiltrate. Consolidation of the left lower lobe is again identified with associated small pleural effusion. Consolidation of the right lower lobe is again identified similar to that seen on the most recent CT examination. Large pleural effusion on the right is again identified with  some peripheral enhancement suggestive of empyema. A somewhat loculated component of fluid is noted along the medial aspect of the right apex but communicates with the more inferior effusion.Previously seen right chest tube has been removed in the interval. No pneumothorax is identified. No parenchymal nodules are identified. Musculoskeletal: Rib fractures are again identified on the right similar to that seen on prior exam. These involve the third through ninth ribs on the right. Previously seen scapular fracture on the right is again noted as well. No compression deformities are seen within the thoracic spine. In the right anterior chest wall, there is a focal fluid collection identified which measures 5.9 x 2.0 by 6.6 cm in greatest transverse, AP and craniocaudad projections. This likely represents a focal posttraumatic hematoma but is more organized when compared with the prior exams. No findings to suggest superinfection are noted at this time. CT ABDOMEN PELVIS FINDINGS Hepatobiliary: No focal liver abnormality is seen. No gallstones, gallbladder wall thickening, or biliary dilatation. Pancreas: Unremarkable. No pancreatic ductal dilatation or surrounding inflammatory changes. Spleen: Normal in size without focal abnormality. Adrenals/Urinary Tract: Adrenal glands are within normal limits bilaterally. Kidneys demonstrate a normal enhancement pattern. No renal calculi or obstructive changes are noted. The bladder is well distended. Stomach/Bowel: No obstructive or inflammatory changes of the large or small bowel are seen. A gastrostomy catheter is noted in place. The appendix is not well visualized although no inflammatory changes are seen. Vascular/Lymphatic: Aortic atherosclerosis. No enlarged abdominal or pelvic lymph nodes. Reproductive: Prostate is unremarkable. Other: No free fluid is noted.  No hernia is seen. Musculoskeletal: Compression deformity of L2 is noted stable in appearance right inferior  pubic ramus fracture and right acetabular fracture are again seen and stable. IMPRESSION: CT of the chest: Increasing right-sided pleural effusion with evidence of loculation and peripheral enhancement suggestive of empyema. New fluid collection in the anterior chest wall on the right likely related to focal hematoma. No signs of superinfection are seen. New left upper lobe infiltrate consistent with pneumonia. This was not visualized on the recent exam from 07/27/2019. Persistent consolidation within the lower lobes bilaterally right greater than left. Multiple rib fractures and right scapular fracture stable in appearance. CT of the abdomen and pelvis: Fractures involving the right inferior pubic ramus and right acetabulum stable from the prior study. Chronic L2 compression deformity. No new focal abnormality is noted within the abdomen and pelvis. Electronically Signed   By: Inez Catalina M.D.   On: 08/01/2019 18:54   DG Chest Port 1 View  Result Date: 08/01/2019 CLINICAL DATA:  Respiratory failure EXAM: PORTABLE CHEST 1 VIEW COMPARISON:  July 31, 2019 FINDINGS: Tracheostomy catheter tip is 5.5 cm above the carina. No pneumothorax. Moderate to large right pleural effusion remains.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

## 2019-08-02 NOTE — Progress Notes (Signed)
Inpatient Rehab Admissions Coordinator:   Note pt continuing to require significant vent support.  Will sign off for now.  Please re-consult if appropriate.    Estill Dooms, PT, DPT Admissions Coordinator [phone] 08/02/19  2:40 PM
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
## 2019-08-03 ENCOUNTER — Inpatient Hospital Stay (HOSPITAL_COMMUNITY): Payer: Medicaid Other

## 2019-08-03 LAB — BASIC METABOLIC PANEL
Anion gap: 10 (ref 5–15)
BUN: 25 mg/dL — ABNORMAL HIGH (ref 8–23)
CO2: 30 mmol/L (ref 22–32)
Calcium: 8.8 mg/dL — ABNORMAL LOW (ref 8.9–10.3)
Chloride: 106 mmol/L (ref 98–111)
Creatinine, Ser: 0.79 mg/dL (ref 0.61–1.24)
GFR calc Af Amer: >60 mL/min (ref >60)
GFR calc non Af Amer: >60 mL/min (ref >60)
Glucose, Bld: 106 mg/dL — ABNORMAL HIGH (ref 70–99)
Potassium: 4.1 mmol/L (ref 3.5–5.1)
Sodium: 146 mmol/L — ABNORMAL HIGH (ref 135–145)

## 2019-08-03 LAB — CULTURE, RESPIRATORY W GRAM STAIN

## 2019-08-03 LAB — HEPARIN LEVEL (UNFRACTIONATED)
Heparin Unfractionated: 0.55 [IU]/mL (ref 0.30–0.70)
Heparin Unfractionated: 0.65 [IU]/mL (ref 0.30–0.70)

## 2019-08-03 LAB — CULTURE, BLOOD (ROUTINE X 2)
Culture: NO GROWTH
Culture: NO GROWTH
Special Requests: ADEQUATE

## 2019-08-03 LAB — GLUCOSE, CAPILLARY
Glucose-Capillary: 102 mg/dL — ABNORMAL HIGH (ref 70–99)
Glucose-Capillary: 101 mg/dL — ABNORMAL HIGH (ref 70–99)
Glucose-Capillary: 114 mg/dL — ABNORMAL HIGH (ref 70–99)
Glucose-Capillary: 95 mg/dL (ref 70–99)
Glucose-Capillary: 141 mg/dL — ABNORMAL HIGH (ref 70–99)
Glucose-Capillary: 132 mg/dL — ABNORMAL HIGH (ref 70–99)

## 2019-08-03 LAB — CBC
HCT: 29 % — ABNORMAL LOW (ref 39.0–52.0)
Hemoglobin: 8.7 g/dL — ABNORMAL LOW (ref 13.0–17.0)
MCH: 28.2 pg (ref 26.0–34.0)
MCHC: 30 g/dL (ref 30.0–36.0)
MCV: 93.9 fL (ref 80.0–100.0)
Platelets: 675 10*3/uL — ABNORMAL HIGH (ref 150–400)
RBC: 3.09 MIL/uL — ABNORMAL LOW (ref 4.22–5.81)
RDW: 14.3 % (ref 11.5–15.5)
WBC: 15.3 10*3/uL — ABNORMAL HIGH (ref 4.0–10.5)
nRBC: 0 % (ref 0.0–0.2)

## 2019-08-03 LAB — POCT I-STAT 7, (LYTES, BLD GAS, ICA,H+H)
Acid-Base Excess: 7 mmol/L — ABNORMAL HIGH (ref 0.0–2.0)
Bicarbonate: 32.2 mmol/L — ABNORMAL HIGH (ref 20.0–28.0)
Calcium, Ion: 1.28 mmol/L (ref 1.15–1.40)
HCT: 31 % — ABNORMAL LOW (ref 39.0–52.0)
Hemoglobin: 10.5 g/dL — ABNORMAL LOW (ref 13.0–17.0)
O2 Saturation: 97 %
Patient temperature: 99.8
Potassium: 4 mmol/L (ref 3.5–5.1)
Sodium: 143 mmol/L (ref 135–145)
TCO2: 34 mmol/L — ABNORMAL HIGH (ref 22–32)
pCO2 arterial: 48.4 mmHg — ABNORMAL HIGH (ref 32.0–48.0)
pH, Arterial: 7.434 (ref 7.350–7.450)
pO2, Arterial: 93 mmHg (ref 83.0–108.0)

## 2019-08-03 LAB — TRIGLYCERIDES: Triglycerides: 105 mg/dL (ref <150)

## 2019-08-03 MED ORDER — GUAIFENESIN 100 MG/5ML PO SOLN
10.0000 mL | ORAL | Status: DC
  Administered 2019-08-03 – 2019-08-10 (×42): 200 mg
  Filled 2019-08-03 (×42): qty 15

## 2019-08-03 MED ORDER — ROCURONIUM BROMIDE 50 MG/5ML IV SOLN
100.0000 mg | Freq: Once | INTRAVENOUS | Status: DC | PRN
  Filled 2019-08-03 (×2): qty 10

## 2019-08-03 MED ORDER — MIDAZOLAM HCL 2 MG/2ML IJ SOLN
INTRAMUSCULAR | Status: AC
  Filled 2019-08-03: qty 2

## 2019-08-03 MED ORDER — FENTANYL CITRATE (PF) 100 MCG/2ML IJ SOLN
INTRAMUSCULAR | Status: AC
  Filled 2019-08-03: qty 2

## 2019-08-03 MED ORDER — QUETIAPINE FUMARATE 200 MG PO TABS
400.0000 mg | ORAL_TABLET | Freq: Two times a day (BID) | ORAL | Status: DC
  Administered 2019-08-03 – 2019-08-10 (×14): 400 mg
  Filled 2019-08-03 (×14): qty 2

## 2019-08-03 MED ORDER — QUETIAPINE FUMARATE 200 MG PO TABS
200.0000 mg | ORAL_TABLET | Freq: Once | ORAL | Status: AC
  Administered 2019-08-03: 200 mg via ORAL
  Filled 2019-08-03: qty 1

## 2019-08-03 MED ORDER — GLYCOPYRROLATE 0.2 MG/ML IJ SOLN
0.2000 mg | Freq: Three times a day (TID) | INTRAMUSCULAR | Status: DC
  Administered 2019-08-03 – 2019-08-08 (×15): 0.2 mg via INTRAVENOUS
  Filled 2019-08-03 (×15): qty 1

## 2019-08-03 MED ORDER — HEPARIN (PORCINE) 25000 UT/250ML-% IV SOLN
2250.0000 [IU]/h | INTRAVENOUS | Status: DC
  Administered 2019-08-04: 1750 [IU]/h via INTRAVENOUS
  Administered 2019-08-05: 2250 [IU]/h via INTRAVENOUS
  Administered 2019-08-05: 08:00:00 2050 [IU]/h via INTRAVENOUS
  Administered 2019-08-06 – 2019-08-07 (×3): 2250 [IU]/h via INTRAVENOUS
  Filled 2019-08-03 (×9): qty 250

## 2019-08-03 MED ORDER — GADOBUTROL 1 MMOL/ML IV SOLN
7.0000 mL | Freq: Once | INTRAVENOUS | Status: AC | PRN
  Administered 2019-08-03: 7 mL via INTRAVENOUS

## 2019-08-03 NOTE — Progress Notes (Signed)
Stable from a neurological perspective.  Still opens his eyes.  Appears minimally aware if at all.  Semipurposeful movements with his extremities.  Not following commands.  Continue supportive efforts.  No new recommendations.

## 2019-08-03 NOTE — Procedures (Signed)
Interventional Radiology Procedure Note  Procedure:  CT guided right chest tube placement, 69F.   Complications: None  Recommendations:  - Follow up culture - Do not submerge - Routine chest drain care   Signed,  Yvone Neu. Loreta Ave, DO
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
## 2019-08-03 NOTE — Progress Notes (Signed)
Subjective: Patient mechanically ventilated  Antibiotics:  Anti-infectives (From admission, onward)   Start     Dose/Rate Route Frequency Ordered Stop   07/27/19 1630  ceFEPIme (MAXIPIME) 2 g in sodium chloride 0.9 % 100 mL IVPB     2 g 200 mL/hr over 30 Minutes Intravenous Every 8 hours 07/27/19 1616     07/27/19 1400  ceFAZolin (ANCEF) IVPB 2g/100 mL premix  Status:  Discontinued     2 g 200 mL/hr over 30 Minutes Intravenous Every 8 hours 07/27/19 1005 07/27/19 1611   07/27/19 0700  ceFEPIme (MAXIPIME) 2 g in sodium chloride 0.9 % 100 mL IVPB  Status:  Discontinued     2 g 200 mL/hr over 30 Minutes Intravenous Every 8 hours 07/27/19 0649 07/27/19 1005      Medications: Scheduled Meds: . fentaNYL      . midazolam      . acetaminophen  1,000 mg Per Tube Q6H  . bethanechol  25 mg Per Tube TID  . chlorhexidine gluconate (MEDLINE KIT)  15 mL Mouth Rinse BID  . Chlorhexidine Gluconate Cloth  6 each Topical Daily  . clonazePAM  0.5 mg Per Tube Daily  . docusate  100 mg Per Tube BID  . feeding supplement (PRO-STAT SUGAR FREE 64)  30 mL Per Tube Daily  . glycopyrrolate  0.2 mg Intravenous TID  . guaiFENesin  10 mL Per Tube Q4H  . insulin aspart  1-3 Units Subcutaneous Q4H  . mouth rinse  15 mL Mouth Rinse 10 times per day  . methocarbamol  1,000 mg Per Tube Q8H  . multivitamin with minerals  1 tablet Per Tube Daily  . oxyCODONE  10 mg Per Tube Q6H  . pantoprazole sodium  40 mg Per Tube Daily  . polyethylene glycol  17 g Per Tube BID  . QUEtiapine  400 mg Per Tube BID  . sodium chloride flush  10-40 mL Intracatheter Q12H  . sodium chloride flush  10-40 mL Intracatheter Q12H   Continuous Infusions: . sodium chloride Stopped (08/03/19 1225)  . sodium chloride    . ceFEPime (MAXIPIME) IV Stopped (08/03/19 0851)  . feeding supplement (PIVOT 1.5 CAL) Stopped (08/03/19 0000)  . fentaNYL infusion INTRAVENOUS 150 mcg/hr (08/03/19 1300)  . heparin Stopped (08/03/19 1152)    . propofol (DIPRIVAN) infusion 30 mcg/kg/min (08/03/19 0636)   PRN Meds:.sodium chloride, Place/Maintain arterial line **AND** sodium chloride, ibuprofen, metoprolol tartrate, midazolam, morphine injection, ondansetron **OR** ondansetron (ZOFRAN) IV, rocuronium, sodium chloride flush, sodium chloride flush    Objective: Weight change: 0 kg  Intake/Output Summary (Last 24 hours) at 08/03/2019 1347 Last data filed at 08/03/2019 1300 Gross per 24 hour  Intake 2299.25 ml  Output 1300 ml  Net 999.25 ml   Blood pressure 112/66, pulse (!) 122, temperature 100 F (37.8 C), temperature source Axillary, resp. rate (!) 24, height '5\' 6"'$  (1.676 m), weight 72.1 kg, SpO2 98 %. Temp:  [98.1 F (36.7 C)-100 F (37.8 C)] 100 F (37.8 C) (04/28 1200) Pulse Rate:  [92-125] 122 (04/28 1340) Resp:  [19-26] 24 (04/28 1340) BP: (96-161)/(47-101) 112/66 (04/28 1340) SpO2:  [88 %-99 %] 98 % (04/28 1340) FiO2 (%):  [50 %] 50 % (04/28 1056) Weight:  [72.1 kg] 72.1 kg (04/27 2200)  Physical Exam: General on the ventilator HEENT: anicteric sclera, pupils equal no exudates  CVS tachycardic rate, murmurs gallops or rubs heard Chest: , On ventilator coarse breath sounds  abdomen: soft  non-distended,   Neuro: nonfocal moving extremities  CBC:    BMET Recent Labs    08/02/19 0459 08/02/19 0459 08/03/19 0332 08/03/19 0813  NA 144   < > 143 146*  K 4.1   < > 4.0 4.1  CL 107  --   --  106  CO2 30  --   --  30  GLUCOSE 151*  --   --  106*  BUN 31*  --   --  25*  CREATININE 0.72  --   --  0.79  CALCIUM 8.3*  --   --  8.8*   < > = values in this interval not displayed.     Liver Panel  No results for input(s): PROT, ALBUMIN, AST, ALT, ALKPHOS, BILITOT, BILIDIR, IBILI in the last 72 hours.     Sedimentation Rate No results for input(s): ESRSEDRATE in the last 72 hours. C-Reactive Protein No results for input(s): CRP in the last 72 hours.  Micro Results: Recent Results (from the past  720 hour(s))  Respiratory Panel by RT PCR (Flu A&B, Covid) - Nasopharyngeal Swab     Status: None   Collection Time: 07/14/2019  9:47 PM   Specimen: Nasopharyngeal Swab  Result Value Ref Range Status   SARS Coronavirus 2 by RT PCR NEGATIVE NEGATIVE Final    Comment: (NOTE) SARS-CoV-2 target nucleic acids are NOT DETECTED. The SARS-CoV-2 RNA is generally detectable in upper respiratoy specimens during the acute phase of infection. The lowest concentration of SARS-CoV-2 viral copies this assay can detect is 131 copies/mL. A negative result does not preclude SARS-Cov-2 infection and should not be used as the sole basis for treatment or other patient management decisions. A negative result may occur with  improper specimen collection/handling, submission of specimen other than nasopharyngeal swab, presence of viral mutation(s) within the areas targeted by this assay, and inadequate number of viral copies (<131 copies/mL). A negative result must be combined with clinical observations, patient history, and epidemiological information. The expected result is Negative. Fact Sheet for Patients:  PinkCheek.be Fact Sheet for Healthcare Providers:  GravelBags.it This test is not yet ap proved or cleared by the [place] FDA and  has been authorized for detection and/or diagnosis of SARS-CoV-2 by FDA under an Emergency Use Authorization (EUA). This EUA will remain  in effect (meaning this test can be used) for the duration of the COVID-19 declaration under Section 564(b)(1) of the Act, 21 U.S.C. section 360bbb-3(b)(1), unless the authorization is terminated or revoked sooner.    Influenza A by PCR NEGATIVE NEGATIVE Final   Influenza B by PCR NEGATIVE NEGATIVE Final    Comment: (NOTE) The Xpert Xpress SARS-CoV-2/FLU/RSV assay is intended as an aid in  the diagnosis of influenza from Nasopharyngeal swab specimens and  should not be used  as a sole basis for treatment. Nasal washings and  aspirates are unacceptable for Xpert Xpress SARS-CoV-2/FLU/RSV  testing. Fact Sheet for Patients: PinkCheek.be Fact Sheet for Healthcare Providers: GravelBags.it This test is not yet approved or cleared by the [place] FDA and  has been authorized for detection and/or diagnosis of SARS-CoV-2 by  FDA under an Emergency Use Authorization (EUA). This EUA will remain  in effect (meaning this test can be used) for the duration of the  Covid-19 declaration under Section 564(b)(1) of the Act, 21  U.S.C. section 360bbb-3(b)(1), unless the authorization is  terminated or revoked. Performed at [facility], [place] [address] [place],  [address]  MRSA PCR Screening     Status: None   Collection Time: 07/20/19  5:32 PM   Specimen: Nasal Mucosa; Nasopharyngeal  Result Value Ref Range Status   MRSA by PCR NEGATIVE NEGATIVE Final    Comment:        The GeneXpert MRSA Assay (FDA approved for NASAL specimens only), is one component of a comprehensive MRSA colonization surveillance program. It is not intended to diagnose MRSA infection nor to guide or monitor treatment for MRSA infections. Performed at [facility], [place] [address], [place], [place] [address]   Culture, blood (routine x 2)     Status: Abnormal   Collection Time: 07/26/19 10:03 AM   Specimen: BLOOD LEFT ARM  Result Value Ref Range Status   Specimen Description BLOOD LEFT ARM  Final   Special Requests   Final    BOTTLES DRAWN AEROBIC ONLY Blood Culture results may not be optimal due to an inadequate volume of blood received in culture bottles   Culture  Setup Time   Final    AEROBIC BOTTLE ONLY GRAM POSITIVE COCCI IN CLUSTERS CRITICAL RESULT CALLED TO, READ BACK BY AND VERIFIED WITH: PHRMD J MILLEN '@0640'$  07/27/19 BY S GEZAHEGN Performed at [facility], [address], [place],   [address]    Culture STAPHYLOCOCCUS AUREUS (A)  Final   Report Status 07/26/2019 FINAL  Final   Organism ID, Bacteria STAPHYLOCOCCUS AUREUS  Final      Susceptibility   Staphylococcus aureus - MIC*    CIPROFLOXACIN <=0.5 SENSITIVE Sensitive     ERYTHROMYCIN <=0.25 SENSITIVE Sensitive     GENTAMICIN <=0.5 SENSITIVE Sensitive     OXACILLIN <=0.25 SENSITIVE Sensitive     TETRACYCLINE <=1 SENSITIVE Sensitive     VANCOMYCIN <=0.5 SENSITIVE Sensitive     TRIMETH/SULFA <=10 SENSITIVE Sensitive     CLINDAMYCIN <=0.25 SENSITIVE Sensitive     RIFAMPIN <=0.5 SENSITIVE Sensitive     Inducible Clindamycin NEGATIVE Sensitive     * STAPHYLOCOCCUS AUREUS  Blood Culture ID Panel (Reflexed)     Status: Abnormal   Collection Time: 07/26/19 10:03 AM  Result Value Ref Range Status   Enterococcus species NOT DETECTED NOT DETECTED Final   Listeria monocytogenes NOT DETECTED NOT DETECTED Final   Staphylococcus species DETECTED (A) NOT DETECTED Final    Comment: CRITICAL RESULT CALLED TO, READ BACK BY AND VERIFIED WITH: PHRMD J MILLEN '@0640'$  07/27/19 BY S GEZAHEGN    Staphylococcus aureus (BCID) DETECTED (A) NOT DETECTED Final    Comment: Methicillin (oxacillin) susceptible Staphylococcus aureus (MSSA). Preferred therapy is anti staphylococcal beta lactam antibiotic (Cefazolin or Nafcillin), unless clinically contraindicated. CRITICAL RESULT CALLED TO, READ BACK BY AND VERIFIED WITH: PHRMD J MILLEN '@0640'$  07/27/19 BY S GEZAHEGN    Methicillin resistance NOT DETECTED NOT DETECTED Final   Streptococcus species NOT DETECTED NOT DETECTED Final   Streptococcus agalactiae NOT DETECTED NOT DETECTED Final   Streptococcus pneumoniae NOT DETECTED NOT DETECTED Final   Streptococcus pyogenes NOT DETECTED NOT DETECTED Final   Acinetobacter baumannii NOT DETECTED NOT DETECTED Final   Enterobacteriaceae species NOT DETECTED NOT DETECTED Final   Enterobacter cloacae complex NOT DETECTED NOT DETECTED Final   Escherichia coli  NOT DETECTED NOT DETECTED Final   Klebsiella oxytoca NOT DETECTED NOT DETECTED Final   Klebsiella pneumoniae NOT DETECTED NOT DETECTED Final   Proteus species NOT DETECTED NOT DETECTED Final   Serratia marcescens NOT DETECTED NOT DETECTED Final   Haemophilus influenzae NOT DETECTED  NOT DETECTED Final   Neisseria meningitidis NOT DETECTED NOT DETECTED Final   Pseudomonas aeruginosa NOT DETECTED NOT DETECTED Final   Candida albicans NOT DETECTED NOT DETECTED Final   Candida glabrata NOT DETECTED NOT DETECTED Final   Candida krusei NOT DETECTED NOT DETECTED Final   Candida parapsilosis NOT DETECTED NOT DETECTED Final   Candida tropicalis NOT DETECTED NOT DETECTED Final    Comment: Performed at [facility], [place] [address] [place], [place] [address]  Culture, respiratory (non-expectorated)     Status: None   Collection Time: 07/26/19 11:16 AM   Specimen: Tracheal Aspirate; Respiratory  Result Value Ref Range Status   Specimen Description TRACHEAL ASPIRATE  Final   Special Requests NONE  Final   Gram Stain   Final    RARE WBC PRESENT, PREDOMINANTLY PMN MODERATE GRAM POSITIVE RODS RARE GRAM NEGATIVE COCCOBACILLI RARE GRAM POSITIVE COCCI IN CLUSTERS    Culture   Final    FEW SERRATIA MARCESCENS MODERATE GROUP B STREP(S.AGALACTIAE)ISOLATED ABUNDANT DIPHTHEROIDS(CORYNEBACTERIUM SPECIES) ORGANISM 2 TESTING AGAINST S. AGALACTIAE NOT ROUTINELY PERFORMED DUE TO PREDICTABILITY OF AMP/PEN/VAN SUSCEPTIBILITY. ORGANISM 3 Standardized susceptibility testing for this organism is not available. Performed at [facility], [place] [address] [place], [place] [address]    Report Status 07/23/2019 FINAL  Final   Organism ID, Bacteria SERRATIA MARCESCENS  Final      Susceptibility   Serratia marcescens - MIC*    CEFAZOLIN >=64 RESISTANT Resistant     CEFEPIME <=1 SENSITIVE Sensitive     CEFTAZIDIME <=1 SENSITIVE Sensitive     CEFTRIAXONE <=1 SENSITIVE Sensitive     CIPROFLOXACIN <=0.25  SENSITIVE Sensitive     GENTAMICIN <=1 SENSITIVE Sensitive     TRIMETH/SULFA <=20 SENSITIVE Sensitive     * FEW SERRATIA MARCESCENS  Culture, blood (routine x 2)     Status: Abnormal   Collection Time: 07/26/19  6:26 PM   Specimen: BLOOD LEFT HAND  Result Value Ref Range Status   Specimen Description BLOOD LEFT HAND  Final   Special Requests   Final    BOTTLES DRAWN AEROBIC ONLY Blood Culture results may not be optimal due to an inadequate volume of blood received in culture bottles   Culture  Setup Time   Final    AEROBIC BOTTLE ONLY GRAM POSITIVE COCCI IN CLUSTERS CRITICAL VALUE NOTED.  VALUE IS CONSISTENT WITH PREVIOUSLY REPORTED AND CALLED VALUE.    Culture (A)  Final    STAPHYLOCOCCUS AUREUS SUSCEPTIBILITIES PERFORMED ON PREVIOUS CULTURE WITHIN THE LAST 5 DAYS. Performed at [facility], [place] [address] [place], [place] [address]    Report Status 07/27/2019 FINAL  Final  Culture, blood (routine x 2)     Status: Abnormal   Collection Time: 07/27/19  2:29 PM   Specimen: BLOOD LEFT HAND  Result Value Ref Range Status   Specimen Description BLOOD LEFT HAND  Final   Special Requests   Final    BOTTLES DRAWN AEROBIC AND ANAEROBIC Blood Culture adequate volume   Culture  Setup Time   Final    IN BOTH AEROBIC AND ANAEROBIC BOTTLES GRAM POSITIVE COCCI IN CLUSTERS CRITICAL VALUE NOTED.  VALUE IS CONSISTENT WITH PREVIOUSLY REPORTED AND CALLED VALUE.    Culture (A)  Final    STAPHYLOCOCCUS AUREUS SUSCEPTIBILITIES PERFORMED ON PREVIOUS CULTURE WITHIN THE LAST 5 DAYS. Performed at [facility], [place] [address] [place], [place] [address]    Report Status 07/31/2019 FINAL  Final  Culture, blood (routine x 2)     Status: None   Collection Time: 07/27/19  2:29 PM   Specimen: BLOOD LEFT ARM  Result Value Ref Range Status   Specimen Description BLOOD LEFT ARM  Final   Special Requests   Final    BOTTLES DRAWN AEROBIC AND ANAEROBIC Blood Culture adequate volume   Culture    Final    NO GROWTH 5 DAYS Performed at [facility], [address] [place], [place] [address]    Report Status 08/01/2019 FINAL  Final  Culture, blood (routine x 2)     Status: None   Collection Time: 07/17/2019 10:48 AM   Specimen: BLOOD RIGHT HAND  Result Value Ref Range Status   Specimen Description BLOOD RIGHT HAND  Final   Special Requests   Final    BOTTLES DRAWN AEROBIC AND ANAEROBIC Blood Culture adequate volume   Culture   Final    NO GROWTH 5 DAYS Performed at [facility], [place] [address] [place], [place] [address]    Report Status 08/03/2019 FINAL  Final  Culture, blood (routine x 2)     Status: None   Collection Time: 08/02/2019 11:00 AM   Specimen: BLOOD RIGHT ARM  Result Value Ref Range Status   Specimen Description BLOOD RIGHT ARM  Final   Special Requests   Final    BOTTLES DRAWN AEROBIC AND ANAEROBIC Blood Culture results may not be optimal due to an inadequate volume of blood received in culture bottles   Culture   Final    NO GROWTH 5 DAYS Performed at [facility], [place] [address], [place], [place] [address]    Report Status 08/03/2019 FINAL  Final  Culture, respiratory (non-expectorated)     Status: None   Collection Time: 08/01/19  9:35 AM   Specimen: Tracheal Aspirate; Respiratory  Result Value Ref Range Status   Specimen Description TRACHEAL ASPIRATE  Final   Special Requests NONE  Final   Gram Stain   Final    ABUNDANT WBC PRESENT, PREDOMINANTLY PMN FEW SQUAMOUS EPITHELIAL CELLS PRESENT FEW GRAM NEGATIVE COCCOBACILLI RARE GRAM POSITIVE COCCI IN PAIRS RARE GRAM POSITIVE RODS Performed at [facility], [place] [address], [place], [place] [address]    Culture FEW SERRATIA MARCESCENS  Final   Report Status 08/03/2019 FINAL  Final   Organism ID, Bacteria SERRATIA MARCESCENS  Final      Susceptibility   Serratia marcescens - MIC*    CEFAZOLIN >=64 RESISTANT Resistant     CEFEPIME <=1 SENSITIVE Sensitive     CEFTAZIDIME <=1  SENSITIVE Sensitive     CEFTRIAXONE <=1 SENSITIVE Sensitive     CIPROFLOXACIN <=0.25 SENSITIVE Sensitive     GENTAMICIN <=1 SENSITIVE Sensitive     TRIMETH/SULFA <=20 SENSITIVE Sensitive     * FEW SERRATIA MARCESCENS  Fungus culture, blood     Status: None (Preliminary result)   Collection Time: 08/01/19  5:10 PM   Specimen: BLOOD  Result Value Ref Range Status   Specimen Description BLOOD LEFT ANTECUBITAL  Final   Special Requests   Final    BOTTLES DRAWN AEROBIC AND ANAEROBIC Blood Culture adequate volume   Culture   Final    NO GROWTH 2 DAYS Performed at [facility], [place] [address] [place], [place] [address]    Report Status PENDING  Incomplete    Studies/Results: CT CHEST W CONTRAST  Result Date: 08/01/2019 CLINICAL DATA:  History of  empyema and fevers EXAM: CT CHEST, ABDOMEN, AND PELVIS WITH CONTRAST TECHNIQUE: Multidetector CT imaging of the chest, abdomen and pelvis was performed following the standard protocol during bolus administration of intravenous contrast. CONTRAST:  186m OMNIPAQUE IOHEXOL 300 MG/ML  SOLN COMPARISON:  CT of the chest from 07/27/2019, CT of the chest abdomen and pelvis from 07/30/2019 FINDINGS: CT CHEST FINDINGS Cardiovascular: No significant cardiac enlargement is noted. Mild atherosclerotic calcifications noted. The aorta shows no aneurysmal dilatation or dissection. No large central embolus is seen. Right-sided central venous catheter has been removed in the interval. Mediastinum/Nodes: The thoracic inlet demonstrates evidence of a tracheostomy tube in satisfactory position. Esophagus as visualized is within normal limits. No sizable hilar or mediastinal adenopathy is noted. Thyroid is within normal limits. Lungs/Pleura: Airspace opacity is identified increased in the left upper lobe consistent with new infiltrate. Consolidation of the left lower lobe is again identified with associated small pleural effusion. Consolidation of the right lower lobe is  again identified similar to that seen on the most recent CT examination. Large pleural effusion on the right is again identified with some peripheral enhancement suggestive of empyema. A somewhat loculated component of fluid is noted along the medial aspect of the right apex but communicates with the more inferior effusion.Previously seen right chest tube has been removed in the interval. No pneumothorax is identified. No parenchymal nodules are identified. Musculoskeletal: Rib fractures are again identified on the right similar to that seen on prior exam. These involve the third through ninth ribs on the right. Previously seen scapular fracture on the right is again noted as well. No compression deformities are seen within the thoracic spine. In the right anterior chest wall, there is a focal fluid collection identified which measures 5.9 x 2.0 by 6.6 cm in greatest transverse, AP and craniocaudad projections. This likely represents a focal posttraumatic hematoma but is more organized when compared with the prior exams. No findings to suggest superinfection are noted at this time. CT ABDOMEN PELVIS FINDINGS Hepatobiliary: No focal liver abnormality is seen. No gallstones, gallbladder wall thickening, or biliary dilatation. Pancreas: Unremarkable. No pancreatic ductal dilatation or surrounding inflammatory changes. Spleen: Normal in size without focal abnormality. Adrenals/Urinary Tract: Adrenal glands are within normal limits bilaterally. Kidneys demonstrate a normal enhancement pattern. No renal calculi or obstructive changes are noted. The bladder is well distended. Stomach/Bowel: No obstructive or inflammatory changes of the large or small bowel are seen. A gastrostomy catheter is noted in place. The appendix is not well visualized although no inflammatory changes are seen. Vascular/Lymphatic: Aortic atherosclerosis. No enlarged abdominal or pelvic lymph nodes. Reproductive: Prostate is unremarkable. Other: No  free fluid is noted.  No hernia is seen. Musculoskeletal: Compression deformity of L2 is noted stable in appearance right inferior pubic ramus fracture and right acetabular fracture are again seen and stable. IMPRESSION: CT of the chest: Increasing right-sided pleural effusion with evidence of loculation and peripheral enhancement suggestive of empyema. New fluid collection in the anterior chest wall on the right likely related to focal hematoma. No signs of superinfection are seen. New left upper lobe infiltrate consistent with pneumonia. This was not visualized on the recent exam from 07/27/2019. Persistent consolidation within the lower lobes bilaterally right greater than left. Multiple rib fractures and right scapular fracture stable in appearance. CT of the abdomen and pelvis: Fractures involving the right inferior pubic ramus and right acetabulum stable from the prior study. Chronic L2 compression deformity. No new focal abnormality is noted within the  abdomen and pelvis. Electronically Signed   By: Inez Catalina M.D.   On: 08/01/2019 18:54   CT ABDOMEN PELVIS W CONTRAST  Result Date: 08/01/2019 CLINICAL DATA:  History of empyema and fevers EXAM: CT CHEST, ABDOMEN, AND PELVIS WITH CONTRAST TECHNIQUE: Multidetector CT imaging of the chest, abdomen and pelvis was performed following the standard protocol during bolus administration of intravenous contrast. CONTRAST:  173m OMNIPAQUE IOHEXOL 300 MG/ML  SOLN COMPARISON:  CT of the chest from 07/27/2019, CT of the chest abdomen and pelvis from 07/31/2019 FINDINGS: CT CHEST FINDINGS Cardiovascular: No significant cardiac enlargement is noted. Mild atherosclerotic calcifications noted. The aorta shows no aneurysmal dilatation or dissection. No large central embolus is seen. Right-sided central venous catheter has been removed in the interval. Mediastinum/Nodes: The thoracic inlet demonstrates evidence of a tracheostomy tube in satisfactory position. Esophagus as  visualized is within normal limits. No sizable hilar or mediastinal adenopathy is noted. Thyroid is within normal limits. Lungs/Pleura: Airspace opacity is identified increased in the left upper lobe consistent with new infiltrate. Consolidation of the left lower lobe is again identified with associated small pleural effusion. Consolidation of the right lower lobe is again identified similar to that seen on the most recent CT examination. Large pleural effusion on the right is again identified with some peripheral enhancement suggestive of empyema. A somewhat loculated component of fluid is noted along the medial aspect of the right apex but communicates with the more inferior effusion.Previously seen right chest tube has been removed in the interval. No pneumothorax is identified. No parenchymal nodules are identified. Musculoskeletal: Rib fractures are again identified on the right similar to that seen on prior exam. These involve the third through ninth ribs on the right. Previously seen scapular fracture on the right is again noted as well. No compression deformities are seen within the thoracic spine. In the right anterior chest wall, there is a focal fluid collection identified which measures 5.9 x 2.0 by 6.6 cm in greatest transverse, AP and craniocaudad projections. This likely represents a focal posttraumatic hematoma but is more organized when compared with the prior exams. No findings to suggest superinfection are noted at this time. CT ABDOMEN PELVIS FINDINGS Hepatobiliary: No focal liver abnormality is seen. No gallstones, gallbladder wall thickening, or biliary dilatation. Pancreas: Unremarkable. No pancreatic ductal dilatation or surrounding inflammatory changes. Spleen: Normal in size without focal abnormality. Adrenals/Urinary Tract: Adrenal glands are within normal limits bilaterally. Kidneys demonstrate a normal enhancement pattern. No renal calculi or obstructive changes are noted. The bladder is  well distended. Stomach/Bowel: No obstructive or inflammatory changes of the large or small bowel are seen. A gastrostomy catheter is noted in place. The appendix is not well visualized although no inflammatory changes are seen. Vascular/Lymphatic: Aortic atherosclerosis. No enlarged abdominal or pelvic lymph nodes. Reproductive: Prostate is unremarkable. Other: No free fluid is noted.  No hernia is seen. Musculoskeletal: Compression deformity of L2 is noted stable in appearance right inferior pubic ramus fracture and right acetabular fracture are again seen and stable. IMPRESSION: CT of the chest: Increasing right-sided pleural effusion with evidence of loculation and peripheral enhancement suggestive of empyema. New fluid collection in the anterior chest wall on the right likely related to focal hematoma. No signs of superinfection are seen. New left upper lobe infiltrate consistent with pneumonia. This was not visualized on the recent exam from 07/27/2019. Persistent consolidation within the lower lobes bilaterally right greater than left. Multiple rib fractures and right scapular fracture stable in appearance.  CT of the abdomen and pelvis: Fractures involving the

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
## 2019-08-04 DIAGNOSIS — J869 Pyothorax without fistula: Secondary | ICD-10-CM

## 2019-08-04 LAB — GLUCOSE, CAPILLARY
Glucose-Capillary: 154 mg/dL — ABNORMAL HIGH (ref 70–99)
Glucose-Capillary: 150 mg/dL — ABNORMAL HIGH (ref 70–99)
Glucose-Capillary: 127 mg/dL — ABNORMAL HIGH (ref 70–99)
Glucose-Capillary: 114 mg/dL — ABNORMAL HIGH (ref 70–99)
Glucose-Capillary: 143 mg/dL — ABNORMAL HIGH (ref 70–99)
Glucose-Capillary: 116 mg/dL — ABNORMAL HIGH (ref 70–99)

## 2019-08-04 LAB — BASIC METABOLIC PANEL
Anion gap: 6 (ref 5–15)
BUN: 27 mg/dL — ABNORMAL HIGH (ref 8–23)
CO2: 29 mmol/L (ref 22–32)
Calcium: 8.1 mg/dL — ABNORMAL LOW (ref 8.9–10.3)
Chloride: 107 mmol/L (ref 98–111)
Creatinine, Ser: 0.7 mg/dL (ref 0.61–1.24)
GFR calc Af Amer: >60 mL/min (ref >60)
GFR calc non Af Amer: >60 mL/min (ref >60)
Glucose, Bld: 161 mg/dL — ABNORMAL HIGH (ref 70–99)
Potassium: 4.2 mmol/L (ref 3.5–5.1)
Sodium: 142 mmol/L (ref 135–145)

## 2019-08-04 LAB — CBC
HCT: 25.7 % — ABNORMAL LOW (ref 39.0–52.0)
Hemoglobin: 7.9 g/dL — ABNORMAL LOW (ref 13.0–17.0)
MCH: 28.7 pg (ref 26.0–34.0)
MCHC: 30.7 g/dL (ref 30.0–36.0)
MCV: 93.5 fL (ref 80.0–100.0)
Platelets: 624 10*3/uL — ABNORMAL HIGH (ref 150–400)
RBC: 2.75 MIL/uL — ABNORMAL LOW (ref 4.22–5.81)
RDW: 14.4 % (ref 11.5–15.5)
WBC: 14.7 10*3/uL — ABNORMAL HIGH (ref 4.0–10.5)
nRBC: 0 % (ref 0.0–0.2)

## 2019-08-04 LAB — HEPARIN LEVEL (UNFRACTIONATED)
Heparin Unfractionated: <0.1 [IU]/mL — ABNORMAL LOW (ref 0.30–0.70)
Heparin Unfractionated: <0.1 [IU]/mL — ABNORMAL LOW (ref 0.30–0.70)
Heparin Unfractionated: <0.1 [IU]/mL — ABNORMAL LOW (ref 0.30–0.70)

## 2019-08-04 LAB — TRIGLYCERIDES: Triglycerides: 104 mg/dL (ref <150)

## 2019-08-04 LAB — PHOSPHORUS: Phosphorus: 2.5 mg/dL (ref 2.5–4.6)

## 2019-08-04 MED ORDER — HEPARIN BOLUS VIA INFUSION
2000.0000 [IU] | Freq: Once | INTRAVENOUS | Status: AC
  Administered 2019-08-04: 08:00:00 2000 [IU] via INTRAVENOUS
  Filled 2019-08-04: qty 2000

## 2019-08-04 NOTE — Progress Notes (Signed)
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                      +----------+------------+---------+-----------+----------+-------+ Brachial      None                 No                Acute  +----------+------------+---------+-----------+----------+-------+ Radial        Full                                          +----------+------------+---------+-----------+----------+-------+ Ulnar         Full                                          +----------+------------+---------+-----------+----------+-------+ Cephalic      Full                                          +----------+------------+---------+-----------+----------+-------+ Basilic       Full                                          +----------+------------+---------+-----------+----------+-------+  Summary:  Right: No evidence of deep vein thrombosis in the upper extremity. No evidence of superficial vein thrombosis in the upper extremity.  Left: No evidence of superficial vein thrombosis in the upper extremity. Findings consistent with acute deep  vein thrombosis involving the left brachial veins.  *See table(s) above for measurements and observations.  Diagnosing physician: Waverly Ferrari MD Electronically signed by Waverly Ferrari MD on 08/02/2019 at 3:56:32 PM.    Final    [place] EKG SITE RITE  Result Date: 08/02/2019 If Site Rite image not attached, placement could not be confirmed due to current cardiac rhythm.   Labs:  CBC: Recent Labs    08/01/19 0446 08/01/19  0900 08/02/19 0459 08/03/19 0332 08/03/19 0813 08/04/19 0259  WBC 20.0*  --  14.1*  --  15.3* 14.7*  HGB 9.4*   < > 8.1* 10.5* 8.7* 7.9*  HCT 30.9*   < > 26.9* 31.0* 29.0* 25.7*  PLT 495*  --  521*  --  675* 624*   < > = values in this interval not displayed.    COAGS: Recent Labs    2019-07-27 2120  INR 1.1    BMP: Recent Labs    08/01/19 0446 08/01/19 0900 08/02/19 0459 08/03/19 0332 08/03/19 0813 08/04/19 0259  NA 147*   < > 144 143 146* 142  K 4.5   < > 4.1 4.0 4.1 4.2  CL 111  --  107  --  106 107  CO2 32  --  30  --  30 29  GLUCOSE 166*  --  151*  --  106* 161*  BUN 27*  --  31*  --  25* 27*  CALCIUM 8.8*  --  8.3*  --  8.8* 8.1*  CREATININE 0.63  --  0.72  --  0.79 0.70  GFRNONAA >60  --  >60  --  >60 >60  GFRAA >60  --  >60  --  >60 >60   < > = values in this interval not displayed.    LIVER FUNCTION TESTS: Recent Labs    2019/07/27 2120  BILITOT 0.7  AST 26  ALT 20  ALKPHOS 78  PROT 6.3*  ALBUMIN 3.6    Assessment and Plan:  Empyema  Rt chest tube drain intact No air leak 1.6 L out into Pleurvac so far Will follow  Electronically Signed: Lavonia Drafts, PA-C 08/04/2019, 9:38 AM   I spent a total of 15 Minutes at the the patient's bedside AND on the patient's [place] floor or unit, greater than 50% of which was counseling/coordinating care for Rt Chest tube
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
## 2019-08-04 NOTE — Progress Notes (Signed)
SLP Cancellation Note  Patient Details Name: Stephen Juarez MRN: [id number] DOB: 04/28/1955   Cancelled treatment:       Reason Eval/Treat Not Completed: Patient not medically ready; remains on vent, cognitively not appropriate for in-line valve. SLP will continue to follow.     Mahala Menghini., M.A. CCC-SLP Acute Rehabilitation Services Pager [phone] Office [phone]  08/04/2019, 8:07 AM
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
## 2019-08-04 NOTE — Progress Notes (Signed)
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 bacteremia significant pulmonary pathology status post pneumothorax  with chest tube.  He was broadened from cefazolin to cefepime due to Serratia isolated on culture.    1.  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  While Serratia was isolated on culture and has been targeted I am skeptical that it was responsible for his ongoing fevers, that did improve in the last 24 + hours  Trauma wants to give him more extended course of gram negative therapy than typical duration recommended for pneumonia and to  give him 14 days.   My suspicion has been  that fevers are more ongoing due to his empyema and his poor ability to clear his secretions.  I wonder if bronchoscopy might help with mehanical removal of these     2.  MSSA bacteremia with mitral valve endocarditis: no septic emboli to CNS on MRI brain  Given no septic emboli to CNS could go with cefazolin rather than nafcillin when narrowing back to anti MSSA therapy  3. Empyema: cultures incubating and currently  Largely  Blood tinged fluid. May still  Require decortication.     LOS: 16 days   Alcide Evener 08/04/2019, 6:07 PM

## 2019-08-05 ENCOUNTER — Other Ambulatory Visit (HOSPITAL_COMMUNITY): Payer: Medicaid Other

## 2019-08-05 DIAGNOSIS — I058 Other rheumatic mitral valve diseases: Secondary | ICD-10-CM

## 2019-08-05 DIAGNOSIS — S069X9D Unspecified intracranial injury with loss of consciousness of unspecified duration, subsequent encounter: Secondary | ICD-10-CM

## 2019-08-05 LAB — CBC
HCT: 26.3 % — ABNORMAL LOW (ref 39.0–52.0)
Hemoglobin: 7.9 g/dL — ABNORMAL LOW (ref 13.0–17.0)
MCH: 28.2 pg (ref 26.0–34.0)
MCHC: 30 g/dL (ref 30.0–36.0)
MCV: 93.9 fL (ref 80.0–100.0)
Platelets: 663 10*3/uL — ABNORMAL HIGH (ref 150–400)
RBC: 2.8 MIL/uL — ABNORMAL LOW (ref 4.22–5.81)
RDW: 14.5 % (ref 11.5–15.5)
WBC: 16.2 10*3/uL — ABNORMAL HIGH (ref 4.0–10.5)
nRBC: 0 % (ref 0.0–0.2)

## 2019-08-05 LAB — BASIC METABOLIC PANEL
Anion gap: 7 (ref 5–15)
BUN: 23 mg/dL (ref 8–23)
CO2: 29 mmol/L (ref 22–32)
Calcium: 8.1 mg/dL — ABNORMAL LOW (ref 8.9–10.3)
Chloride: 105 mmol/L (ref 98–111)
Creatinine, Ser: 0.64 mg/dL (ref 0.61–1.24)
GFR calc Af Amer: >60 mL/min (ref >60)
GFR calc non Af Amer: >60 mL/min (ref >60)
Glucose, Bld: 130 mg/dL — ABNORMAL HIGH (ref 70–99)
Potassium: 3.9 mmol/L (ref 3.5–5.1)
Sodium: 141 mmol/L (ref 135–145)

## 2019-08-05 LAB — GLUCOSE, CAPILLARY
Glucose-Capillary: 124 mg/dL — ABNORMAL HIGH (ref 70–99)
Glucose-Capillary: 113 mg/dL — ABNORMAL HIGH (ref 70–99)
Glucose-Capillary: 117 mg/dL — ABNORMAL HIGH (ref 70–99)
Glucose-Capillary: 127 mg/dL — ABNORMAL HIGH (ref 70–99)
Glucose-Capillary: 131 mg/dL — ABNORMAL HIGH (ref 70–99)
Glucose-Capillary: 89 mg/dL (ref 70–99)

## 2019-08-05 LAB — HEPARIN LEVEL (UNFRACTIONATED)
Heparin Unfractionated: <0.1 [IU]/mL — ABNORMAL LOW (ref 0.30–0.70)
Heparin Unfractionated: 0.24 [IU]/mL — ABNORMAL LOW (ref 0.30–0.70)
Heparin Unfractionated: 0.29 [IU]/mL — ABNORMAL LOW (ref 0.30–0.70)

## 2019-08-05 MED ORDER — SODIUM PHOSPHATES 45 MMOLE/15ML IV SOLN
20.0000 mmol | Freq: Once | INTRAVENOUS | Status: AC
  Administered 2019-08-05: 20 mmol via INTRAVENOUS
  Filled 2019-08-05: qty 6.67

## 2019-08-05 MED ORDER — CLONAZEPAM 1 MG PO TABS
1.0000 mg | ORAL_TABLET | Freq: Two times a day (BID) | ORAL | Status: DC
  Administered 2019-08-05 (×2): 1 mg
  Filled 2019-08-05 (×2): qty 1
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
## 2019-08-05 NOTE — Progress Notes (Signed)
Large soft mass felt on patient's right upper chest. MD notified of finding. Will continue to monitor. Stephen Juarez
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
## 2019-08-05 NOTE — Progress Notes (Signed)
Patient in need of another IV. MD consulted about placing a peripheral in the L arm that has DVT. She confirmed that it was okay. IV placed. Stephen Juarez

## 2019-08-05 NOTE — Progress Notes (Signed)
Assisted tele visit to patient with family member.  Maksim Peregoy M, RN   
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
## 2019-08-06 DIAGNOSIS — Z515 Encounter for palliative care: Secondary | ICD-10-CM

## 2019-08-06 DIAGNOSIS — S069X9A Unspecified intracranial injury with loss of consciousness of unspecified duration, initial encounter: Secondary | ICD-10-CM

## 2019-08-06 DIAGNOSIS — I609 Nontraumatic subarachnoid hemorrhage, unspecified: Secondary | ICD-10-CM

## 2019-08-06 DIAGNOSIS — S270XXA Traumatic pneumothorax, initial encounter: Secondary | ICD-10-CM

## 2019-08-06 DIAGNOSIS — S2241XA Multiple fractures of ribs, right side, initial encounter for closed fracture: Secondary | ICD-10-CM

## 2019-08-06 LAB — CBC
HCT: 25.5 % — ABNORMAL LOW (ref 39.0–52.0)
Hemoglobin: 7.8 g/dL — ABNORMAL LOW (ref 13.0–17.0)
MCH: 28.7 pg (ref 26.0–34.0)
MCHC: 30.6 g/dL (ref 30.0–36.0)
MCV: 93.8 fL (ref 80.0–100.0)
Platelets: 656 10*3/uL — ABNORMAL HIGH (ref 150–400)
RBC: 2.72 MIL/uL — ABNORMAL LOW (ref 4.22–5.81)
RDW: 14.7 % (ref 11.5–15.5)
WBC: 15.1 10*3/uL — ABNORMAL HIGH (ref 4.0–10.5)
nRBC: 0 % (ref 0.0–0.2)

## 2019-08-06 LAB — BASIC METABOLIC PANEL WITH GFR
Anion gap: 7 (ref 5–15)
BUN: 21 mg/dL (ref 8–23)
CO2: 29 mmol/L (ref 22–32)
Calcium: 8 mg/dL — ABNORMAL LOW (ref 8.9–10.3)
Chloride: 103 mmol/L (ref 98–111)
Creatinine, Ser: 0.63 mg/dL (ref 0.61–1.24)
GFR calc Af Amer: >60 mL/min (ref >60)
GFR calc non Af Amer: >60 mL/min (ref >60)
Glucose, Bld: 155 mg/dL — ABNORMAL HIGH (ref 70–99)
Potassium: 4.1 mmol/L (ref 3.5–5.1)
Sodium: 139 mmol/L (ref 135–145)

## 2019-08-06 LAB — GLUCOSE, CAPILLARY
Glucose-Capillary: 139 mg/dL — ABNORMAL HIGH (ref 70–99)
Glucose-Capillary: 132 mg/dL — ABNORMAL HIGH (ref 70–99)
Glucose-Capillary: 129 mg/dL — ABNORMAL HIGH (ref 70–99)
Glucose-Capillary: 117 mg/dL — ABNORMAL HIGH (ref 70–99)
Glucose-Capillary: 121 mg/dL — ABNORMAL HIGH (ref 70–99)
Glucose-Capillary: 118 mg/dL — ABNORMAL HIGH (ref 70–99)

## 2019-08-06 LAB — HEPARIN LEVEL (UNFRACTIONATED)
Heparin Unfractionated: 0.39 [IU]/mL (ref 0.30–0.70)
Heparin Unfractionated: 0.42 [IU]/mL (ref 0.30–0.70)

## 2019-08-06 MED ORDER — FUROSEMIDE 10 MG/ML IJ SOLN
20.0000 mg | Freq: Once | INTRAMUSCULAR | Status: AC
  Administered 2019-08-06: 20 mg via INTRAVENOUS
  Filled 2019-08-06: qty 2

## 2019-08-06 MED ORDER — CLONAZEPAM 1 MG PO TABS
1.0000 mg | ORAL_TABLET | Freq: Three times a day (TID) | ORAL | Status: DC
  Administered 2019-08-06 – 2019-08-08 (×6): 1 mg
  Filled 2019-08-06 (×6): qty 1

## 2019-08-06 MED ORDER — OXYCODONE HCL 5 MG/5ML PO SOLN: 5.0000 mg | ORAL | Status: DC | PRN
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
## 2019-08-06 NOTE — Plan of Care (Signed)
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
## 2019-08-06 DEATH — deceased

## 2019-08-07 LAB — GLUCOSE, CAPILLARY
Glucose-Capillary: 113 mg/dL — ABNORMAL HIGH (ref 70–99)
Glucose-Capillary: 120 mg/dL — ABNORMAL HIGH (ref 70–99)
Glucose-Capillary: 135 mg/dL — ABNORMAL HIGH (ref 70–99)
Glucose-Capillary: 125 mg/dL — ABNORMAL HIGH (ref 70–99)
Glucose-Capillary: 112 mg/dL — ABNORMAL HIGH (ref 70–99)
Glucose-Capillary: 128 mg/dL — ABNORMAL HIGH (ref 70–99)

## 2019-08-07 LAB — BASIC METABOLIC PANEL
Anion gap: 3 — ABNORMAL LOW (ref 5–15)
BUN: 21 mg/dL (ref 8–23)
CO2: 30 mmol/L (ref 22–32)
Calcium: 8.2 mg/dL — ABNORMAL LOW (ref 8.9–10.3)
Chloride: 104 mmol/L (ref 98–111)
Creatinine, Ser: 0.44 mg/dL — ABNORMAL LOW (ref 0.61–1.24)
GFR calc Af Amer: >60 mL/min (ref >60)
GFR calc non Af Amer: >60 mL/min (ref >60)
Glucose, Bld: 131 mg/dL — ABNORMAL HIGH (ref 70–99)
Potassium: 4.2 mmol/L (ref 3.5–5.1)
Sodium: 137 mmol/L (ref 135–145)

## 2019-08-07 LAB — CBC
HCT: 25.9 % — ABNORMAL LOW (ref 39.0–52.0)
Hemoglobin: 7.8 g/dL — ABNORMAL LOW (ref 13.0–17.0)
MCH: 28.3 pg (ref 26.0–34.0)
MCHC: 30.1 g/dL (ref 30.0–36.0)
MCV: 93.8 fL (ref 80.0–100.0)
Platelets: 644 10*3/uL — ABNORMAL HIGH (ref 150–400)
RBC: 2.76 MIL/uL — ABNORMAL LOW (ref 4.22–5.81)
RDW: 14.6 % (ref 11.5–15.5)
WBC: 15.8 10*3/uL — ABNORMAL HIGH (ref 4.0–10.5)
nRBC: 0 % (ref 0.0–0.2)

## 2019-08-07 LAB — TRIGLYCERIDES: Triglycerides: 74 mg/dL (ref <150)

## 2019-08-07 LAB — PHOSPHORUS: Phosphorus: 2.1 mg/dL — ABNORMAL LOW (ref 2.5–4.6)

## 2019-08-07 LAB — MAGNESIUM: Magnesium: 2.2 mg/dL (ref 1.7–2.4)

## 2019-08-07 LAB — HEPARIN LEVEL (UNFRACTIONATED): Heparin Unfractionated: 0.39 [IU]/mL (ref 0.30–0.70)

## 2019-08-07 MED ORDER — SODIUM PHOSPHATES 45 MMOLE/15ML IV SOLN
30.0000 mmol | Freq: Once | INTRAVENOUS | Status: AC
  Administered 2019-08-07: 30 mmol via INTRAVENOUS
  Filled 2019-08-07: qty 10
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
## 2019-08-07 NOTE — Plan of Care (Signed)
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
## 2019-08-08 ENCOUNTER — Inpatient Hospital Stay (HOSPITAL_COMMUNITY): Payer: Medicaid Other

## 2019-08-08 DIAGNOSIS — J869 Pyothorax without fistula: Secondary | ICD-10-CM

## 2019-08-08 DIAGNOSIS — Z515 Encounter for palliative care: Secondary | ICD-10-CM

## 2019-08-08 LAB — BASIC METABOLIC PANEL
Anion gap: 4 — ABNORMAL LOW (ref 5–15)
BUN: 21 mg/dL (ref 8–23)
CO2: 31 mmol/L (ref 22–32)
Calcium: 8.1 mg/dL — ABNORMAL LOW (ref 8.9–10.3)
Chloride: 101 mmol/L (ref 98–111)
Creatinine, Ser: 0.53 mg/dL — ABNORMAL LOW (ref 0.61–1.24)
GFR calc Af Amer: >60 mL/min (ref >60)
GFR calc non Af Amer: >60 mL/min (ref >60)
Glucose, Bld: 128 mg/dL — ABNORMAL HIGH (ref 70–99)
Potassium: 4.5 mmol/L (ref 3.5–5.1)
Sodium: 136 mmol/L (ref 135–145)

## 2019-08-08 LAB — FUNGUS CULTURE, BLOOD
Culture: NO GROWTH
Special Requests: ADEQUATE

## 2019-08-08 LAB — CBC
HCT: 25.3 % — ABNORMAL LOW (ref 39.0–52.0)
Hemoglobin: 7.8 g/dL — ABNORMAL LOW (ref 13.0–17.0)
MCH: 28.8 pg (ref 26.0–34.0)
MCHC: 30.8 g/dL (ref 30.0–36.0)
MCV: 93.4 fL (ref 80.0–100.0)
Platelets: 568 10*3/uL — ABNORMAL HIGH (ref 150–400)
RBC: 2.71 MIL/uL — ABNORMAL LOW (ref 4.22–5.81)
RDW: 14.7 % (ref 11.5–15.5)
WBC: 15.1 10*3/uL — ABNORMAL HIGH (ref 4.0–10.5)
nRBC: 0 % (ref 0.0–0.2)

## 2019-08-08 LAB — AEROBIC/ANAEROBIC CULTURE W GRAM STAIN (SURGICAL/DEEP WOUND): Culture: NO GROWTH

## 2019-08-08 LAB — MAGNESIUM: Magnesium: 2.1 mg/dL (ref 1.7–2.4)

## 2019-08-08 LAB — GLUCOSE, CAPILLARY
Glucose-Capillary: 113 mg/dL — ABNORMAL HIGH (ref 70–99)
Glucose-Capillary: 112 mg/dL — ABNORMAL HIGH (ref 70–99)

## 2019-08-08 LAB — PHOSPHORUS: Phosphorus: 2.4 mg/dL — ABNORMAL LOW (ref 2.5–4.6)

## 2019-08-08 LAB — HEPARIN LEVEL (UNFRACTIONATED): Heparin Unfractionated: 0.4 [IU]/mL (ref 0.30–0.70)

## 2019-08-08 MED ORDER — LORAZEPAM 1 MG PO TABS: 1.0000 mg | ORAL_TABLET | ORAL | Status: DC | PRN

## 2019-08-08 MED ORDER — SCOPOLAMINE 1 MG/3DAYS TD PT72
1.0000 | MEDICATED_PATCH | TRANSDERMAL | Status: DC
  Administered 2019-08-08: 1.5 mg via TRANSDERMAL
  Filled 2019-08-08: qty 1

## 2019-08-08 MED ORDER — LORAZEPAM 2 MG/ML IJ SOLN: 1.0000 mg | INTRAMUSCULAR | Status: DC | PRN

## 2019-08-08 MED ORDER — ONDANSETRON 4 MG PO TBDP: 4.0000 mg | ORAL_TABLET | Freq: Four times a day (QID) | ORAL | Status: DC | PRN

## 2019-08-08 MED ORDER — SODIUM PHOSPHATES 45 MMOLE/15ML IV SOLN
30.0000 mmol | Freq: Once | INTRAVENOUS | Status: DC
  Filled 2019-08-08: qty 10

## 2019-08-08 MED ORDER — ONDANSETRON HCL 4 MG/2ML IJ SOLN: 4.0000 mg | Freq: Four times a day (QID) | INTRAMUSCULAR | Status: DC | PRN

## 2019-08-08 MED ORDER — HALOPERIDOL LACTATE 5 MG/ML IJ SOLN: 0.5000 mg | INTRAMUSCULAR | Status: DC | PRN

## 2019-08-08 MED ORDER — LORAZEPAM 2 MG/ML IJ SOLN: 0.5000 mg | Freq: Four times a day (QID) | INTRAMUSCULAR | Status: DC

## 2019-08-08 MED ORDER — POLYVINYL ALCOHOL 1.4 % OP SOLN
1.0000 [drp] | Freq: Four times a day (QID) | OPHTHALMIC | Status: DC | PRN
  Filled 2019-08-08: qty 15

## 2019-08-08 MED ORDER — GLYCOPYRROLATE 0.2 MG/ML IJ SOLN: 0.2000 mg | INTRAMUSCULAR | Status: DC | PRN

## 2019-08-08 MED ORDER — LORAZEPAM 2 MG/ML PO CONC: 1.0000 mg | ORAL | Status: DC | PRN

## 2019-08-08 MED ORDER — GLYCOPYRROLATE 0.2 MG/ML IJ SOLN
0.3000 mg | Freq: Three times a day (TID) | INTRAMUSCULAR | Status: DC
  Administered 2019-08-08 – 2019-08-10 (×6): 0.3 mg via INTRAVENOUS
  Filled 2019-08-08 (×6): qty 2

## 2019-08-08 MED ORDER — HALOPERIDOL LACTATE 2 MG/ML PO CONC
0.5000 mg | ORAL | Status: DC | PRN
  Filled 2019-08-08: qty 0.3

## 2019-08-08 MED ORDER — HALOPERIDOL 0.5 MG PO TABS
0.5000 mg | ORAL_TABLET | ORAL | Status: DC | PRN
  Filled 2019-08-08: qty 1

## 2019-08-08 MED ORDER — LORAZEPAM 2 MG/ML IJ SOLN
1.0000 mg | Freq: Four times a day (QID) | INTRAMUSCULAR | Status: DC
  Administered 2019-08-08 – 2019-08-10 (×6): 1 mg via INTRAVENOUS
  Filled 2019-08-08 (×7): qty 1

## 2019-08-08 MED ORDER — LORAZEPAM 2 MG/ML IJ SOLN: 0.5000 mg | Freq: Two times a day (BID) | INTRAMUSCULAR | Status: DC

## 2019-08-08 MED ORDER — GLYCOPYRROLATE 1 MG PO TABS
1.0000 mg | ORAL_TABLET | ORAL | Status: DC | PRN
  Filled 2019-08-08: qty 1

## 2019-08-08 MED ORDER — BIOTENE DRY MOUTH MT LIQD: 15.0000 mL | OROMUCOSAL | Status: DC | PRN

## 2019-08-08 NOTE — Progress Notes (Signed)
Nutrition Brief Note  Chart reviewed. Pt now transitioning to comfort care.  No further nutrition interventions warranted at this time.  Please re-consult as needed.   Adelaide Pfefferkorn P., RD, LDN, CNSC See AMiON for contact information     

## 2019-08-08 NOTE — Progress Notes (Signed)
    [facility]  Date of Admission:  08/16/2019            BRIEF PROGRESS NOTE:  ID made aware of plan of care change to comfort care. Antibiotics have been discontinued and ID will sign off. Continue supportive care per palliative care and primary team.   Thank you for the opportunity to contribute in Stephen Juarez's care.   Marcos Eke, NP [facility]  08/08/2019  11:22 AM

## 2019-08-08 NOTE — Progress Notes (Signed)
Family to visit Stephen Juarez   Lavenia Atlas [place]) Signor PJ Jamal Collin Donza [facility] April Brown Barringer Durward Hiemstra Audie Marti Sleigh Gina Nunes Paul Flora Lipps Tiffany Chubbuck Pastor Rick Davis Kim Penson Josh Buikema Aleta (Edie) Vicente Males Annette Stable) Webb Silversmith

## 2019-08-08 NOTE — Progress Notes (Signed)
SLP Cancellation Note  Patient Details Name: Kavir Savoca MRN: [id number] DOB: [birth year]   Cancelled treatment:       Reason Eval/Treat Not Completed: Patient not medically ready. Pt still on vent, sedated; not following commands. Will continue to follow.     Mahala Menghini., M.A. CCC-SLP Acute Rehabilitation Services Pager [phone] Office [phone]  08/08/2019, 8:34 AM

## 2019-08-08 NOTE — Progress Notes (Signed)
    Referring Physician(s): Dr Cliffton Asters  Supervising Physician: Oley Balm  Patient Status:  [facility] - In-pt  Chief Complaint: Rt empyema Rt chest tube placed in IR 08/03/19   Subjective:  No response On vent; sedated Chest tube intact [place] in place No OP seen  Allergies: Patient has no allergy information on record.  Medications: Prior to Admission medications   Not on File     Vital Signs: BP 105/63   Pulse 99   Temp 98.4 F (36.9 C) (Axillary)   Resp 16   Ht 5\' 6"  (1.676 m)   Wt 158 lb 15.2 oz (72.1 kg)   SpO2 96%   BMI 25.66 kg/m   Physical Exam Skin:    General: Skin is warm and dry.     Comments: Site clean and dry- reported to me  OP in vac tubing is bloody/serous None collected yet in [place] No air leak     Imaging: No results found.  Labs:  CBC: Recent Labs    08/05/19 0500 08/06/19 0435 08/07/19 0535 08/08/19 0457  WBC 16.2* 15.1* 15.8* 15.1*  HGB 7.9* 7.8* 7.8* 7.8*  HCT 26.3* 25.5* 25.9* 25.3*  PLT 663* 656* 644* 568*    COAGS: Recent Labs    08/03/2019 2120  INR 1.1    BMP: Recent Labs    08/05/19 0500 08/06/19 0930 08/07/19 0535 08/08/19 0457  NA 141 139 137 136  K 3.9 4.1 4.2 4.5  CL 105 103 104 101  CO2 29 29 30 31   GLUCOSE 130* 155* 131* 128*  BUN 23 21 21 21   CALCIUM 8.1* 8.0* 8.2* 8.1*  CREATININE 0.64 0.63 0.44* 0.53*  GFRNONAA >60 >60 >60 >60  GFRAA >60 >60 >60 >60    LIVER FUNCTION TESTS: Recent Labs    08/04/2019 2120  BILITOT 0.7  AST 26  ALT 20  ALKPHOS 78  PROT 6.3*  ALBUMIN 3.6    Assessment and Plan:  Rt empyema Chest tube intact Plan per CCS and CCM I will order port CXR today    Electronically Signed: , PA-C 08/08/2019, 8:09 AM   I spent a total of 15 Minutes at the the patient's bedside AND on the patient's hospital floor or unit, greater than 50% of which was counseling/coordinating care for Rt empyema
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
## 2019-08-08 NOTE — Progress Notes (Signed)
PT Cancellation Note  Patient Details Name: Stephen Juarez MRN: [id number] DOB: [birth year]   Cancelled Treatment:    Reason Eval/Treat Not Completed: Other (comment). Per RN palliative and family meeting at 10am. Family leaning towards withdrawing care. PT to hold until family decision is made on medical plan of care. PT to return if appropriate/requested by family.  Lewis Shock, PT, DPT Acute Rehabilitation Services Pager #: [phone] Office #: [phone]    [name] 08/08/2019, 8:31 AM
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
## 2019-08-09 DIAGNOSIS — T07XXXA Unspecified multiple injuries, initial encounter: Secondary | ICD-10-CM | POA: Diagnosis present

## 2019-08-09 DIAGNOSIS — Z7189 Other specified counseling: Secondary | ICD-10-CM

## 2019-08-09 MED ORDER — SODIUM PHOSPHATES 45 MMOLE/15ML IV SOLN
30.0000 mmol | Freq: Once | INTRAVENOUS | Status: AC
  Administered 2019-08-09: 30 mmol via INTRAVENOUS
  Filled 2019-08-09: qty 10
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
## 2019-08-10 DIAGNOSIS — S069X6A Unspecified intracranial injury with loss of consciousness greater than 24 hours without return to pre-existing conscious level with patient surviving, initial encounter: Secondary | ICD-10-CM

## 2019-08-10 DIAGNOSIS — Z515 Encounter for palliative care: Secondary | ICD-10-CM

## 2019-08-10 DIAGNOSIS — J9621 Acute and chronic respiratory failure with hypoxia: Secondary | ICD-10-CM

## 2019-08-10 MED ORDER — ACETAMINOPHEN 325 MG PO TABS: 650.0000 mg | ORAL_TABLET | Freq: Four times a day (QID) | ORAL | Status: DC | PRN

## 2019-08-10 MED ORDER — ACETAMINOPHEN 650 MG RE SUPP: 650.0000 mg | Freq: Four times a day (QID) | RECTAL | Status: DC | PRN

## 2019-08-10 MED ORDER — FENTANYL BOLUS VIA INFUSION
50.0000 ug | INTRAVENOUS | Status: DC | PRN
  Filled 2019-08-10: qty 50

## 2019-08-10 MED ORDER — MIDAZOLAM 50MG/50ML (1MG/ML) PREMIX INFUSION
0.0000 mg/h | INTRAVENOUS | Status: DC
  Administered 2019-08-10: 19:00:00 4 mg/h via INTRAVENOUS
  Administered 2019-08-10: 11:00:00 2 mg/h via INTRAVENOUS
  Filled 2019-08-10 (×2): qty 50

## 2019-08-10 MED ORDER — MIDAZOLAM BOLUS VIA INFUSION (WITHDRAWAL LIFE SUSTAINING TX)
2.0000 mg | INTRAVENOUS | Status: DC | PRN
  Filled 2019-08-10: qty 2

## 2019-08-11 ENCOUNTER — Encounter (HOSPITAL_BASED_OUTPATIENT_CLINIC_OR_DEPARTMENT_OTHER): Payer: Self-pay | Admitting: Orthopaedic Surgery

## 2019-09-06 NOTE — Discharge Summary (Signed)
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 +----------+------------+---------+-----------+----------+-------+ RIGHT     CompressiblePhasicitySpontaneousPropertiesSummary +----------+------------+---------+-----------+----------+-------+ Subclavian    Full       Yes       Yes                      +----------+------------+---------+-----------+----------+-------+ Axillary      Full       Yes       Yes                      +----------+------------+---------+-----------+----------+-------+  Brachial      Full       Yes       Yes                      +----------+------------+---------+-----------+----------+-------+ Radial        Full                                          +----------+------------+---------+-----------+----------+-------+ Ulnar         Full                                          +----------+------------+---------+-----------+----------+-------+ Cephalic      Full                                          +----------+------------+---------+-----------+----------+-------+ Basilic       Full                                          +----------+------------+---------+-----------+----------+-------+  Left Findings: +----------+------------+---------+-----------+----------+-------+ LEFT      CompressiblePhasicitySpontaneousPropertiesSummary +----------+------------+---------+-----------+----------+-------+ Subclavian    Full       Yes       Yes                       +----------+------------+---------+-----------+----------+-------+ Axillary      Full       Yes       Yes                      +----------+------------+---------+-----------+----------+-------+ Brachial      None                 No                Acute  +----------+------------+---------+-----------+----------+-------+ Radial        Full                                          +----------+------------+---------+-----------+----------+-------+ Ulnar         Full                                          +----------+------------+---------+-----------+----------+-------+ Cephalic      Full                                          +----------+------------+---------+-----------+----------+-------+ Basilic       Full                                          +----------+------------+---------+-----------+----------+-------+  Summary:  Right: No evidence of deep vein thrombosis in the upper extremity.  No evidence of superficial vein thrombosis in the upper extremity.  Left: No evidence of superficial vein thrombosis in the upper extremity. Findings consistent with acute deep vein thrombosis involving the left brachial veins.  *See table(s) above for measurements and observations.  Diagnosing physician: Waverly Ferrari MD Electronically signed by Waverly Ferrari MD on 08/02/2019 at 3:56:32 PM.    Final    ECHOCARDIOGRAM LIMITED  Result Date: 07/27/2019    ECHOCARDIOGRAM LIMITED REPORT   Patient Name:   LIONELL MATUSZAK Date of Exam: 07/27/2019 Medical Rec #:  [id number]        Height:       66.0 in Accession #:    [id number]       Weight:       152.1 lb Date of Birth:  04-29-1955        BSA:          1.780 m Patient Age:    63 years         BP:           110/67 mmHg Patient Gender: M                HR:           103 bpm. Exam Location:  Inpatient Procedure: Limited Echo Indications:    bacteremia  History:        Patient has prior history of Echocardiogram examinations, most                  recent 07/22/2019. No prior cardiac hx on file.  Sonographer:    Celene Skeen RDCS (AE) Referring Phys: [id number] STEPHANIE N DIXON IMPRESSIONS  1. Left ventricular ejection fraction, by estimation, is 65 to 70%. The left ventricle has normal function.  2. Right ventricular systolic function is normal. The right ventricular size is mildly enlarged.  3. The mitral valve is grossly normal.  4. The aortic valve is tricuspid. Aortic valve regurgitation is not visualized. No aortic stenosis is present.  5. The inferior vena cava is normal in size with greater than 50% respiratory variability, suggesting right atrial pressure of 3 mmHg. Comparison(s): No significant change from prior study. Conclusion(s)/Recommendation(s): No evidence of valvular vegetations on this transthoracic echocardiogram. Would recommend a transesophageal echocardiogram to exclude infective endocarditis if clinically indicated. FINDINGS  Left Ventricle: Left ventricular ejection fraction, by estimation, is 65 to 70%. The left ventricle has normal function. Right Ventricle: The right ventricular size is mildly enlarged. No increase in right ventricular wall thickness. Right ventricular systolic function is normal. Pericardium: There is no evidence of pericardial effusion. Mitral Valve: The mitral valve is grossly normal. Tricuspid Valve: The tricuspid valve is grossly normal. Tricuspid valve regurgitation is not demonstrated. No evidence of tricuspid stenosis. Aortic Valve: The aortic valve is tricuspid. . There is mild thickening and mild calcification of the aortic valve. Aortic valve regurgitation is not visualized. No aortic stenosis is present. There is mild thickening of the aortic valve. There is mild calcification of the aortic valve. Pulmonic Valve: The pulmonic valve was grossly normal. Pulmonic valve regurgitation is not visualized. No evidence of pulmonic stenosis. Venous: The inferior vena cava is normal in size with greater  than 50% respiratory variability, suggesting right atrial pressure of 3 mmHg. Lennie Odor MD Electronically signed by Lennie Odor MD Signature Date/Time: 07/27/2019/5:32:01 PM    Final    [place] EKG SITE RITE  Result Date: 08/02/2019 If Site Rite image not attached, placement could not be confirmed  due to current cardiac rhythm.  [place] EKG SITE RITE  Result Date: 07/10/2019 If [facility] image not attached, placement could not be confirmed due to current cardiac rhythm.   Microbiology No results found for this or any previous visit (from the past 240 hour(s)).  Lab Basic Metabolic Panel: No results for input(s): NA, K, CL, CO2, GLUCOSE, BUN, CREATININE, CALCIUM, MG, PHOS in the last 168 hours. Liver Function Tests: No results for input(s): AST, ALT, ALKPHOS, BILITOT, PROT, ALBUMIN in the last 168 hours. No results for input(s): LIPASE, AMYLASE in the last 168 hours. No results for input(s): AMMONIA in the last 168 hours. CBC: No results for input(s): WBC, NEUTROABS, HGB, HCT, MCV, PLT in the last 168 hours. Cardiac Enzymes: No results for input(s): CKTOTAL, CKMB, CKMBINDEX, TROPONINI in the last 168 hours. Sepsis Labs: No results for input(s): PROCALCITON, WBC, LATICACIDVEN in the last 168 hours.  Procedures/Operations  1. Chest tube placement - Dr. Emelia Loron 07/22/2019 2. Chest tube placement - Dr. Kris Mouton 07/22/19 3. PEG placement - Dr. Gust Rung Lovick 08/05/2019 4. Tracheostomy - Dr. Kris Mouton 07/17/2019   Juliet Rude 08/18/2019, 9:34 AM

## 2019-09-06 NOTE — Progress Notes (Signed)
Time of Death 2033.  Confirmed by myself and Devin Going, RN  225 mL fentanyl & 45 mL Versed wasted with Dorthula Matas, RN

## 2019-09-06 NOTE — Progress Notes (Signed)
Assisted tele visit to patient with daughter, Debbe Odea.  Vena Austria, RN
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
## 2019-09-06 NOTE — Progress Notes (Signed)
Patient taken off vent at this time due to comfort care order. Family and Pallative MD at bedside. No O2 placed per their request. RT to continue to monitor trach as needed

## 2019-09-06 DEATH — deceased
